# Patient Record
Sex: Female | Born: 1960 | Race: Black or African American | Hispanic: No | Marital: Single | Smoking: Current every day smoker
Health system: Southern US, Community
[De-identification: ages and names within clinical notes are randomized; demographics above are authoritative.]

## PROBLEM LIST (undated history)

## (undated) DIAGNOSIS — M329 Systemic lupus erythematosus, unspecified: Secondary | ICD-10-CM

## (undated) DIAGNOSIS — E119 Type 2 diabetes mellitus without complications: Secondary | ICD-10-CM

## (undated) DIAGNOSIS — E7800 Pure hypercholesterolemia, unspecified: Secondary | ICD-10-CM

## (undated) DIAGNOSIS — M1990 Unspecified osteoarthritis, unspecified site: Secondary | ICD-10-CM

## (undated) DIAGNOSIS — M069 Rheumatoid arthritis, unspecified: Secondary | ICD-10-CM

## (undated) DIAGNOSIS — I10 Essential (primary) hypertension: Secondary | ICD-10-CM

## (undated) DIAGNOSIS — G894 Chronic pain syndrome: Secondary | ICD-10-CM

## (undated) DIAGNOSIS — F329 Major depressive disorder, single episode, unspecified: Secondary | ICD-10-CM

## (undated) DIAGNOSIS — E785 Hyperlipidemia, unspecified: Secondary | ICD-10-CM

## (undated) DIAGNOSIS — G43909 Migraine, unspecified, not intractable, without status migrainosus: Secondary | ICD-10-CM

## (undated) DIAGNOSIS — E0500 Thyrotoxicosis with diffuse goiter without thyrotoxic crisis or storm: Secondary | ICD-10-CM

## (undated) DIAGNOSIS — F32A Depression, unspecified: Secondary | ICD-10-CM

## (undated) DIAGNOSIS — IMO0002 Reserved for concepts with insufficient information to code with codable children: Secondary | ICD-10-CM

## (undated) HISTORY — DX: Thyrotoxicosis with diffuse goiter without thyrotoxic crisis or storm: E05.00

## (undated) HISTORY — PX: BACK SURGERY: SHX140

## (undated) HISTORY — DX: Reserved for concepts with insufficient information to code with codable children: IMO0002

## (undated) HISTORY — DX: Depression, unspecified: F32.A

## (undated) HISTORY — DX: Major depressive disorder, single episode, unspecified: F32.9

## (undated) HISTORY — DX: Systemic lupus erythematosus, unspecified: M32.9

## (undated) HISTORY — PX: ABDOMINAL HYSTERECTOMY: SHX81

## (undated) HISTORY — DX: Type 2 diabetes mellitus without complications: E11.9

---

## 1997-11-01 ENCOUNTER — Emergency Department (HOSPITAL_COMMUNITY): Admission: EM | Admit: 1997-11-01 | Discharge: 1997-11-01 | Payer: Self-pay

## 1999-01-24 ENCOUNTER — Emergency Department (HOSPITAL_COMMUNITY): Admission: EM | Admit: 1999-01-24 | Discharge: 1999-01-24 | Payer: Self-pay | Admitting: *Deleted

## 1999-08-25 ENCOUNTER — Ambulatory Visit (HOSPITAL_COMMUNITY): Admission: RE | Admit: 1999-08-25 | Discharge: 1999-08-25 | Payer: Self-pay | Admitting: Family Medicine

## 1999-08-25 ENCOUNTER — Encounter: Payer: Self-pay | Admitting: Family Medicine

## 1999-10-23 ENCOUNTER — Emergency Department (HOSPITAL_COMMUNITY): Admission: EM | Admit: 1999-10-23 | Discharge: 1999-10-23 | Payer: Self-pay | Admitting: Emergency Medicine

## 2000-02-14 ENCOUNTER — Emergency Department (HOSPITAL_COMMUNITY): Admission: EM | Admit: 2000-02-14 | Discharge: 2000-02-14 | Payer: Self-pay | Admitting: Emergency Medicine

## 2000-02-14 ENCOUNTER — Encounter: Payer: Self-pay | Admitting: Emergency Medicine

## 2000-04-03 ENCOUNTER — Emergency Department (HOSPITAL_COMMUNITY): Admission: EM | Admit: 2000-04-03 | Discharge: 2000-04-03 | Payer: Self-pay | Admitting: Emergency Medicine

## 2000-05-18 ENCOUNTER — Emergency Department (HOSPITAL_COMMUNITY): Admission: EM | Admit: 2000-05-18 | Discharge: 2000-05-18 | Payer: Self-pay | Admitting: Emergency Medicine

## 2000-07-15 ENCOUNTER — Emergency Department (HOSPITAL_COMMUNITY): Admission: EM | Admit: 2000-07-15 | Discharge: 2000-07-15 | Payer: Self-pay | Admitting: Emergency Medicine

## 2000-08-16 ENCOUNTER — Encounter: Admission: RE | Admit: 2000-08-16 | Discharge: 2000-09-12 | Payer: Self-pay | Admitting: Orthopedic Surgery

## 2000-08-31 ENCOUNTER — Emergency Department (HOSPITAL_COMMUNITY): Admission: EM | Admit: 2000-08-31 | Discharge: 2000-08-31 | Payer: Self-pay | Admitting: Emergency Medicine

## 2000-08-31 ENCOUNTER — Encounter: Payer: Self-pay | Admitting: Emergency Medicine

## 2001-02-16 ENCOUNTER — Emergency Department (HOSPITAL_COMMUNITY): Admission: EM | Admit: 2001-02-16 | Discharge: 2001-02-16 | Payer: Self-pay | Admitting: Emergency Medicine

## 2001-03-25 ENCOUNTER — Emergency Department (HOSPITAL_COMMUNITY): Admission: EM | Admit: 2001-03-25 | Discharge: 2001-03-25 | Payer: Self-pay | Admitting: Emergency Medicine

## 2001-03-25 ENCOUNTER — Encounter: Payer: Self-pay | Admitting: Emergency Medicine

## 2001-08-15 ENCOUNTER — Encounter: Payer: Self-pay | Admitting: Family Medicine

## 2001-08-15 ENCOUNTER — Ambulatory Visit (HOSPITAL_COMMUNITY): Admission: RE | Admit: 2001-08-15 | Discharge: 2001-08-15 | Payer: Self-pay | Admitting: Family Medicine

## 2001-08-29 ENCOUNTER — Other Ambulatory Visit: Admission: RE | Admit: 2001-08-29 | Discharge: 2001-08-29 | Payer: Self-pay | Admitting: Obstetrics and Gynecology

## 2001-08-29 ENCOUNTER — Encounter (INDEPENDENT_AMBULATORY_CARE_PROVIDER_SITE_OTHER): Payer: Self-pay | Admitting: Specialist

## 2001-08-29 ENCOUNTER — Encounter: Admission: RE | Admit: 2001-08-29 | Discharge: 2001-08-29 | Payer: Self-pay | Admitting: *Deleted

## 2001-10-16 ENCOUNTER — Encounter: Admission: RE | Admit: 2001-10-16 | Discharge: 2001-10-16 | Payer: Self-pay | Admitting: Orthopedic Surgery

## 2001-10-16 ENCOUNTER — Encounter: Payer: Self-pay | Admitting: Orthopedic Surgery

## 2001-10-19 ENCOUNTER — Encounter: Admission: RE | Admit: 2001-10-19 | Discharge: 2001-10-19 | Payer: Self-pay | Admitting: *Deleted

## 2001-10-26 ENCOUNTER — Encounter: Admission: RE | Admit: 2001-10-26 | Discharge: 2001-10-26 | Payer: Self-pay | Admitting: Orthopedic Surgery

## 2001-10-26 ENCOUNTER — Encounter: Payer: Self-pay | Admitting: Orthopedic Surgery

## 2001-11-14 ENCOUNTER — Inpatient Hospital Stay (HOSPITAL_COMMUNITY): Admission: RE | Admit: 2001-11-14 | Discharge: 2001-11-17 | Payer: Self-pay | Admitting: Obstetrics & Gynecology

## 2001-11-14 ENCOUNTER — Encounter (INDEPENDENT_AMBULATORY_CARE_PROVIDER_SITE_OTHER): Payer: Self-pay | Admitting: *Deleted

## 2001-11-25 ENCOUNTER — Inpatient Hospital Stay (HOSPITAL_COMMUNITY): Admission: AD | Admit: 2001-11-25 | Discharge: 2001-11-25 | Payer: Self-pay | Admitting: Obstetrics & Gynecology

## 2002-02-14 ENCOUNTER — Emergency Department (HOSPITAL_COMMUNITY): Admission: EM | Admit: 2002-02-14 | Discharge: 2002-02-14 | Payer: Self-pay | Admitting: Emergency Medicine

## 2002-03-13 ENCOUNTER — Emergency Department (HOSPITAL_COMMUNITY): Admission: EM | Admit: 2002-03-13 | Discharge: 2002-03-13 | Payer: Self-pay | Admitting: Emergency Medicine

## 2002-05-03 HISTORY — PX: BACK SURGERY: SHX140

## 2002-08-28 ENCOUNTER — Encounter: Admission: RE | Admit: 2002-08-28 | Discharge: 2002-08-28 | Payer: Self-pay | Admitting: Neurosurgery

## 2002-08-28 ENCOUNTER — Encounter: Payer: Self-pay | Admitting: Neurosurgery

## 2002-09-25 ENCOUNTER — Encounter: Payer: Self-pay | Admitting: Family Medicine

## 2002-09-25 ENCOUNTER — Ambulatory Visit (HOSPITAL_COMMUNITY): Admission: RE | Admit: 2002-09-25 | Discharge: 2002-09-25 | Payer: Self-pay | Admitting: Family Medicine

## 2002-10-04 ENCOUNTER — Encounter: Payer: Self-pay | Admitting: Neurosurgery

## 2002-10-04 ENCOUNTER — Ambulatory Visit (HOSPITAL_COMMUNITY): Admission: RE | Admit: 2002-10-04 | Discharge: 2002-10-04 | Payer: Self-pay | Admitting: Neurosurgery

## 2002-10-30 ENCOUNTER — Emergency Department (HOSPITAL_COMMUNITY): Admission: EM | Admit: 2002-10-30 | Discharge: 2002-10-30 | Payer: Self-pay | Admitting: Emergency Medicine

## 2003-03-10 ENCOUNTER — Emergency Department (HOSPITAL_COMMUNITY): Admission: EM | Admit: 2003-03-10 | Discharge: 2003-03-10 | Payer: Self-pay | Admitting: Emergency Medicine

## 2003-09-17 ENCOUNTER — Emergency Department (HOSPITAL_COMMUNITY): Admission: EM | Admit: 2003-09-17 | Discharge: 2003-09-17 | Payer: Self-pay

## 2003-11-18 ENCOUNTER — Inpatient Hospital Stay (HOSPITAL_COMMUNITY): Admission: AD | Admit: 2003-11-18 | Discharge: 2003-11-22 | Payer: Self-pay | Admitting: Psychiatry

## 2003-11-18 ENCOUNTER — Encounter: Payer: Self-pay | Admitting: Emergency Medicine

## 2004-04-18 ENCOUNTER — Inpatient Hospital Stay (HOSPITAL_COMMUNITY): Admission: RE | Admit: 2004-04-18 | Discharge: 2004-04-24 | Payer: Self-pay | Admitting: Psychiatry

## 2004-04-18 ENCOUNTER — Ambulatory Visit: Payer: Self-pay | Admitting: Psychiatry

## 2004-05-11 ENCOUNTER — Ambulatory Visit: Payer: Self-pay | Admitting: Family Medicine

## 2004-05-18 ENCOUNTER — Emergency Department (HOSPITAL_COMMUNITY): Admission: EM | Admit: 2004-05-18 | Discharge: 2004-05-18 | Payer: Self-pay | Admitting: Emergency Medicine

## 2004-05-22 ENCOUNTER — Emergency Department (HOSPITAL_COMMUNITY): Admission: EM | Admit: 2004-05-22 | Discharge: 2004-05-22 | Payer: Self-pay | Admitting: Emergency Medicine

## 2004-05-27 ENCOUNTER — Emergency Department (HOSPITAL_COMMUNITY): Admission: EM | Admit: 2004-05-27 | Discharge: 2004-05-27 | Payer: Self-pay | Admitting: Emergency Medicine

## 2004-06-22 ENCOUNTER — Ambulatory Visit: Payer: Self-pay | Admitting: Family Medicine

## 2004-06-26 ENCOUNTER — Emergency Department (HOSPITAL_COMMUNITY): Admission: EM | Admit: 2004-06-26 | Discharge: 2004-06-26 | Payer: Self-pay | Admitting: Emergency Medicine

## 2004-08-25 ENCOUNTER — Emergency Department (HOSPITAL_COMMUNITY): Admission: EM | Admit: 2004-08-25 | Discharge: 2004-08-25 | Payer: Self-pay | Admitting: Emergency Medicine

## 2004-10-15 ENCOUNTER — Emergency Department (HOSPITAL_COMMUNITY): Admission: EM | Admit: 2004-10-15 | Discharge: 2004-10-16 | Payer: Self-pay | Admitting: Emergency Medicine

## 2004-10-20 ENCOUNTER — Inpatient Hospital Stay (HOSPITAL_COMMUNITY): Admission: RE | Admit: 2004-10-20 | Discharge: 2004-10-26 | Payer: Self-pay | Admitting: Psychiatry

## 2004-10-20 ENCOUNTER — Ambulatory Visit: Payer: Self-pay | Admitting: Psychiatry

## 2004-11-02 ENCOUNTER — Inpatient Hospital Stay (HOSPITAL_COMMUNITY): Admission: RE | Admit: 2004-11-02 | Discharge: 2004-11-06 | Payer: Self-pay | Admitting: Psychiatry

## 2004-12-17 ENCOUNTER — Emergency Department (HOSPITAL_COMMUNITY): Admission: EM | Admit: 2004-12-17 | Discharge: 2004-12-17 | Payer: Self-pay | Admitting: Family Medicine

## 2004-12-26 ENCOUNTER — Emergency Department (HOSPITAL_COMMUNITY): Admission: EM | Admit: 2004-12-26 | Discharge: 2004-12-26 | Payer: Self-pay | Admitting: Emergency Medicine

## 2005-01-14 ENCOUNTER — Emergency Department (HOSPITAL_COMMUNITY): Admission: EM | Admit: 2005-01-14 | Discharge: 2005-01-14 | Payer: Self-pay | Admitting: Family Medicine

## 2005-02-10 ENCOUNTER — Emergency Department (HOSPITAL_COMMUNITY): Admission: EM | Admit: 2005-02-10 | Discharge: 2005-02-10 | Payer: Self-pay | Admitting: Emergency Medicine

## 2005-05-01 ENCOUNTER — Emergency Department (HOSPITAL_COMMUNITY): Admission: EM | Admit: 2005-05-01 | Discharge: 2005-05-01 | Payer: Self-pay | Admitting: Family Medicine

## 2005-08-27 ENCOUNTER — Emergency Department (HOSPITAL_COMMUNITY): Admission: EM | Admit: 2005-08-27 | Discharge: 2005-08-28 | Payer: Self-pay | Admitting: Emergency Medicine

## 2005-12-18 ENCOUNTER — Inpatient Hospital Stay (HOSPITAL_COMMUNITY): Admission: EM | Admit: 2005-12-18 | Discharge: 2005-12-22 | Payer: Self-pay | Admitting: *Deleted

## 2005-12-18 ENCOUNTER — Ambulatory Visit: Payer: Self-pay | Admitting: *Deleted

## 2006-01-21 ENCOUNTER — Ambulatory Visit: Payer: Self-pay | Admitting: Internal Medicine

## 2006-02-06 ENCOUNTER — Inpatient Hospital Stay (HOSPITAL_COMMUNITY): Admission: AD | Admit: 2006-02-06 | Discharge: 2006-02-11 | Payer: Self-pay | Admitting: Psychiatry

## 2006-02-06 ENCOUNTER — Ambulatory Visit: Payer: Self-pay | Admitting: Psychiatry

## 2006-03-26 ENCOUNTER — Ambulatory Visit: Payer: Self-pay | Admitting: *Deleted

## 2006-03-26 ENCOUNTER — Inpatient Hospital Stay (HOSPITAL_COMMUNITY): Admission: EM | Admit: 2006-03-26 | Discharge: 2006-03-31 | Payer: Self-pay | Admitting: *Deleted

## 2006-04-09 ENCOUNTER — Emergency Department (HOSPITAL_COMMUNITY): Admission: EM | Admit: 2006-04-09 | Discharge: 2006-04-09 | Payer: Self-pay | Admitting: Emergency Medicine

## 2006-05-03 DIAGNOSIS — I10 Essential (primary) hypertension: Secondary | ICD-10-CM | POA: Insufficient documentation

## 2007-01-23 DIAGNOSIS — G894 Chronic pain syndrome: Secondary | ICD-10-CM

## 2007-01-23 DIAGNOSIS — F341 Dysthymic disorder: Secondary | ICD-10-CM

## 2007-01-23 DIAGNOSIS — IMO0002 Reserved for concepts with insufficient information to code with codable children: Secondary | ICD-10-CM

## 2007-01-23 DIAGNOSIS — D6489 Other specified anemias: Secondary | ICD-10-CM | POA: Insufficient documentation

## 2007-01-23 DIAGNOSIS — K5900 Constipation, unspecified: Secondary | ICD-10-CM | POA: Insufficient documentation

## 2007-01-23 DIAGNOSIS — F1910 Other psychoactive substance abuse, uncomplicated: Secondary | ICD-10-CM

## 2007-12-08 ENCOUNTER — Emergency Department (HOSPITAL_COMMUNITY): Admission: EM | Admit: 2007-12-08 | Discharge: 2007-12-08 | Payer: Self-pay | Admitting: Emergency Medicine

## 2007-12-30 ENCOUNTER — Emergency Department (HOSPITAL_COMMUNITY): Admission: EM | Admit: 2007-12-30 | Discharge: 2007-12-30 | Payer: Self-pay | Admitting: Emergency Medicine

## 2008-01-05 ENCOUNTER — Other Ambulatory Visit: Payer: Self-pay | Admitting: *Deleted

## 2008-01-05 ENCOUNTER — Ambulatory Visit: Payer: Self-pay | Admitting: *Deleted

## 2008-01-05 ENCOUNTER — Inpatient Hospital Stay (HOSPITAL_COMMUNITY): Admission: RE | Admit: 2008-01-05 | Discharge: 2008-01-08 | Payer: Self-pay | Admitting: *Deleted

## 2008-02-01 ENCOUNTER — Emergency Department (HOSPITAL_COMMUNITY): Admission: EM | Admit: 2008-02-01 | Discharge: 2008-02-01 | Payer: Self-pay | Admitting: Emergency Medicine

## 2008-02-17 ENCOUNTER — Emergency Department (HOSPITAL_COMMUNITY): Admission: EM | Admit: 2008-02-17 | Discharge: 2008-02-17 | Payer: Self-pay | Admitting: Emergency Medicine

## 2008-03-22 ENCOUNTER — Emergency Department (HOSPITAL_COMMUNITY): Admission: EM | Admit: 2008-03-22 | Discharge: 2008-03-22 | Payer: Self-pay | Admitting: Emergency Medicine

## 2008-04-16 ENCOUNTER — Ambulatory Visit: Payer: Self-pay | Admitting: Family Medicine

## 2008-04-16 DIAGNOSIS — M069 Rheumatoid arthritis, unspecified: Secondary | ICD-10-CM | POA: Insufficient documentation

## 2008-04-16 DIAGNOSIS — M329 Systemic lupus erythematosus, unspecified: Secondary | ICD-10-CM

## 2008-04-16 LAB — CONVERTED CEMR LAB
AST: 16 units/L (ref 0–37)
Alkaline Phosphatase: 76 units/L (ref 39–117)
BUN: 11 mg/dL (ref 6–23)
Basophils Absolute: 0 10*3/uL (ref 0.0–0.1)
Calcium: 9.5 mg/dL (ref 8.4–10.5)
Chloride: 106 meq/L (ref 96–112)
Creatinine, Ser: 0.68 mg/dL (ref 0.40–1.20)
Eosinophils Absolute: 0.2 10*3/uL (ref 0.0–0.7)
Hemoglobin: 12.2 g/dL (ref 12.0–15.0)
Lymphocytes Relative: 27 % (ref 12–46)
Lymphs Abs: 2 10*3/uL (ref 0.7–4.0)
MCV: 96.3 fL (ref 78.0–100.0)
Monocytes Absolute: 0.9 10*3/uL (ref 0.1–1.0)
Platelets: 296 10*3/uL (ref 150–400)
Potassium: 3.7 meq/L (ref 3.5–5.3)
RDW: 16.1 % — ABNORMAL HIGH (ref 11.5–15.5)
Total Bilirubin: 0.3 mg/dL (ref 0.3–1.2)
Total Protein: 7.9 g/dL (ref 6.0–8.3)
WBC: 7.3 10*3/uL (ref 4.0–10.5)

## 2008-05-07 ENCOUNTER — Telehealth (INDEPENDENT_AMBULATORY_CARE_PROVIDER_SITE_OTHER): Payer: Self-pay | Admitting: Family Medicine

## 2008-05-15 ENCOUNTER — Encounter (INDEPENDENT_AMBULATORY_CARE_PROVIDER_SITE_OTHER): Payer: Self-pay | Admitting: Family Medicine

## 2008-05-21 ENCOUNTER — Encounter (INDEPENDENT_AMBULATORY_CARE_PROVIDER_SITE_OTHER): Payer: Self-pay | Admitting: Family Medicine

## 2008-05-22 ENCOUNTER — Emergency Department (HOSPITAL_COMMUNITY): Admission: EM | Admit: 2008-05-22 | Discharge: 2008-05-22 | Payer: Self-pay | Admitting: Emergency Medicine

## 2008-05-29 ENCOUNTER — Telehealth (INDEPENDENT_AMBULATORY_CARE_PROVIDER_SITE_OTHER): Payer: Self-pay | Admitting: Family Medicine

## 2008-06-04 ENCOUNTER — Emergency Department (HOSPITAL_COMMUNITY): Admission: EM | Admit: 2008-06-04 | Discharge: 2008-06-04 | Payer: Self-pay | Admitting: Emergency Medicine

## 2008-06-12 ENCOUNTER — Encounter (INDEPENDENT_AMBULATORY_CARE_PROVIDER_SITE_OTHER): Payer: Self-pay | Admitting: Family Medicine

## 2008-06-19 ENCOUNTER — Encounter (INDEPENDENT_AMBULATORY_CARE_PROVIDER_SITE_OTHER): Payer: Self-pay | Admitting: Family Medicine

## 2008-06-26 ENCOUNTER — Emergency Department (HOSPITAL_COMMUNITY): Admission: EM | Admit: 2008-06-26 | Discharge: 2008-06-26 | Payer: Self-pay | Admitting: Emergency Medicine

## 2008-06-27 ENCOUNTER — Telehealth (INDEPENDENT_AMBULATORY_CARE_PROVIDER_SITE_OTHER): Payer: Self-pay | Admitting: Family Medicine

## 2008-07-04 ENCOUNTER — Ambulatory Visit: Payer: Self-pay | Admitting: *Deleted

## 2008-07-04 ENCOUNTER — Inpatient Hospital Stay (HOSPITAL_COMMUNITY): Admission: EM | Admit: 2008-07-04 | Discharge: 2008-07-11 | Payer: Self-pay | Admitting: *Deleted

## 2008-07-10 ENCOUNTER — Encounter (INDEPENDENT_AMBULATORY_CARE_PROVIDER_SITE_OTHER): Payer: Self-pay | Admitting: Family Medicine

## 2008-07-16 ENCOUNTER — Encounter (INDEPENDENT_AMBULATORY_CARE_PROVIDER_SITE_OTHER): Payer: Self-pay | Admitting: Family Medicine

## 2008-08-12 ENCOUNTER — Telehealth (INDEPENDENT_AMBULATORY_CARE_PROVIDER_SITE_OTHER): Payer: Self-pay | Admitting: Family Medicine

## 2008-08-25 ENCOUNTER — Emergency Department (HOSPITAL_COMMUNITY): Admission: EM | Admit: 2008-08-25 | Discharge: 2008-08-25 | Payer: Self-pay | Admitting: Emergency Medicine

## 2008-09-16 ENCOUNTER — Telehealth (INDEPENDENT_AMBULATORY_CARE_PROVIDER_SITE_OTHER): Payer: Self-pay | Admitting: Family Medicine

## 2008-09-30 ENCOUNTER — Encounter (INDEPENDENT_AMBULATORY_CARE_PROVIDER_SITE_OTHER): Payer: Self-pay | Admitting: Family Medicine

## 2008-10-07 ENCOUNTER — Emergency Department (HOSPITAL_COMMUNITY): Admission: EM | Admit: 2008-10-07 | Discharge: 2008-10-07 | Payer: Self-pay | Admitting: Emergency Medicine

## 2008-10-13 ENCOUNTER — Emergency Department (HOSPITAL_COMMUNITY): Admission: EM | Admit: 2008-10-13 | Discharge: 2008-10-13 | Payer: Self-pay | Admitting: Emergency Medicine

## 2008-10-23 ENCOUNTER — Encounter (INDEPENDENT_AMBULATORY_CARE_PROVIDER_SITE_OTHER): Payer: Self-pay | Admitting: Internal Medicine

## 2008-11-21 ENCOUNTER — Telehealth (INDEPENDENT_AMBULATORY_CARE_PROVIDER_SITE_OTHER): Payer: Self-pay | Admitting: Internal Medicine

## 2008-11-25 ENCOUNTER — Encounter (INDEPENDENT_AMBULATORY_CARE_PROVIDER_SITE_OTHER): Payer: Self-pay | Admitting: *Deleted

## 2008-12-24 ENCOUNTER — Emergency Department (HOSPITAL_COMMUNITY): Admission: EM | Admit: 2008-12-24 | Discharge: 2008-12-24 | Payer: Self-pay | Admitting: Emergency Medicine

## 2009-05-17 ENCOUNTER — Emergency Department (HOSPITAL_COMMUNITY): Admission: EM | Admit: 2009-05-17 | Discharge: 2009-05-17 | Payer: Self-pay | Admitting: Emergency Medicine

## 2009-08-18 ENCOUNTER — Emergency Department (HOSPITAL_COMMUNITY): Admission: EM | Admit: 2009-08-18 | Discharge: 2009-08-18 | Payer: Self-pay | Admitting: Emergency Medicine

## 2009-11-18 ENCOUNTER — Emergency Department (HOSPITAL_COMMUNITY): Admission: EM | Admit: 2009-11-18 | Discharge: 2009-11-18 | Payer: Self-pay | Admitting: Emergency Medicine

## 2010-01-22 ENCOUNTER — Emergency Department (HOSPITAL_COMMUNITY): Admission: EM | Admit: 2010-01-22 | Discharge: 2010-01-22 | Payer: Self-pay | Admitting: Emergency Medicine

## 2010-01-27 ENCOUNTER — Emergency Department (HOSPITAL_COMMUNITY): Admission: EM | Admit: 2010-01-27 | Discharge: 2010-01-28 | Payer: Self-pay | Admitting: Emergency Medicine

## 2010-02-23 ENCOUNTER — Emergency Department (HOSPITAL_COMMUNITY): Admission: EM | Admit: 2010-02-23 | Discharge: 2010-02-23 | Payer: Self-pay | Admitting: Emergency Medicine

## 2010-07-16 LAB — URINE CULTURE
Colony Count: NO GROWTH
Culture  Setup Time: 201109280539

## 2010-07-16 LAB — URINE MICROSCOPIC-ADD ON

## 2010-07-16 LAB — URINALYSIS, ROUTINE W REFLEX MICROSCOPIC
Bilirubin Urine: NEGATIVE
Hgb urine dipstick: NEGATIVE
Ketones, ur: NEGATIVE mg/dL
Nitrite: NEGATIVE
Specific Gravity, Urine: 1.027 (ref 1.005–1.030)
Urobilinogen, UA: 1 mg/dL (ref 0.0–1.0)

## 2010-07-16 LAB — WET PREP, GENITAL: Clue Cells Wet Prep HPF POC: NONE SEEN

## 2010-08-10 LAB — CBC
HCT: 37.8 % (ref 36.0–46.0)
Hemoglobin: 12.2 g/dL (ref 12.0–15.0)
MCHC: 32.3 g/dL (ref 30.0–36.0)
MCV: 94 fL (ref 78.0–100.0)
Platelets: 278 10*3/uL (ref 150–400)
RBC: 4.02 MIL/uL (ref 3.87–5.11)

## 2010-08-10 LAB — DIFFERENTIAL
Basophils Absolute: 0.1 10*3/uL (ref 0.0–0.1)
Eosinophils Absolute: 0.2 10*3/uL (ref 0.0–0.7)
Lymphs Abs: 1.5 10*3/uL (ref 0.7–4.0)
Monocytes Absolute: 0.6 10*3/uL (ref 0.1–1.0)
Monocytes Relative: 11 % (ref 3–12)
Neutro Abs: 3.3 10*3/uL (ref 1.7–7.7)

## 2010-08-10 LAB — BASIC METABOLIC PANEL
BUN: 9 mg/dL (ref 6–23)
Creatinine, Ser: 0.78 mg/dL (ref 0.4–1.2)
GFR calc non Af Amer: >60 mL/min (ref >60)
Glucose, Bld: 129 mg/dL — ABNORMAL HIGH (ref 70–99)

## 2010-08-10 LAB — SEDIMENTATION RATE: Sed Rate: 49 mm/hr — ABNORMAL HIGH (ref 0–22)

## 2010-08-10 LAB — RAPID STREP SCREEN (MED CTR MEBANE ONLY): Streptococcus, Group A Screen (Direct): NEGATIVE

## 2010-08-12 LAB — URINALYSIS, ROUTINE W REFLEX MICROSCOPIC
Bilirubin Urine: NEGATIVE
Hgb urine dipstick: NEGATIVE
Nitrite: NEGATIVE
Specific Gravity, Urine: 1.014 (ref 1.005–1.030)

## 2010-08-12 LAB — BASIC METABOLIC PANEL
Chloride: 111 mEq/L (ref 96–112)
GFR calc Af Amer: >60 mL/min (ref >60)
GFR calc non Af Amer: >60 mL/min (ref >60)
Potassium: 3.9 mEq/L (ref 3.5–5.1)

## 2010-08-13 LAB — COMPREHENSIVE METABOLIC PANEL
AST: 19 U/L (ref 0–37)
Albumin: 3.8 g/dL (ref 3.5–5.2)
Chloride: 103 mEq/L (ref 96–112)
Creatinine, Ser: 1.04 mg/dL (ref 0.4–1.2)
GFR calc Af Amer: >60 mL/min (ref >60)
GFR calc non Af Amer: 57 mL/min — ABNORMAL LOW (ref >60)
Potassium: 3.4 mEq/L — ABNORMAL LOW (ref 3.5–5.1)
Total Protein: 7.7 g/dL (ref 6.0–8.3)

## 2010-08-13 LAB — CBC
HCT: 36.8 % (ref 36.0–46.0)
Hemoglobin: 11.9 g/dL — ABNORMAL LOW (ref 12.0–15.0)
MCHC: 32.3 g/dL (ref 30.0–36.0)
MCV: 94.3 fL (ref 78.0–100.0)
RBC: 3.91 MIL/uL (ref 3.87–5.11)
RDW: 16 % — ABNORMAL HIGH (ref 11.5–15.5)

## 2010-08-13 LAB — TSH: TSH: 0.842 u[IU]/mL (ref 0.350–4.500)

## 2010-08-17 LAB — CBC
HCT: 33.6 % — ABNORMAL LOW (ref 36.0–46.0)
MCHC: 32.4 g/dL (ref 30.0–36.0)
MCV: 94.7 fL (ref 78.0–100.0)
Platelets: 269 10*3/uL (ref 150–400)
RDW: 15.5 % (ref 11.5–15.5)
WBC: 9.2 10*3/uL (ref 4.0–10.5)

## 2010-08-17 LAB — POCT I-STAT, CHEM 8
Creatinine, Ser: 0.9 mg/dL (ref 0.4–1.2)
HCT: 37 % (ref 36.0–46.0)
Hemoglobin: 12.6 g/dL (ref 12.0–15.0)
Potassium: 4.3 mEq/L (ref 3.5–5.1)
Sodium: 141 mEq/L (ref 135–145)
TCO2: 26 mmol/L (ref 0–100)

## 2010-08-17 LAB — DIFFERENTIAL
Basophils Relative: 0 % (ref 0–1)
Eosinophils Absolute: 0.2 10*3/uL (ref 0.0–0.7)
Eosinophils Relative: 3 % (ref 0–5)
Lymphs Abs: 1.2 10*3/uL (ref 0.7–4.0)
Neutrophils Relative %: 79 % — ABNORMAL HIGH (ref 43–77)

## 2010-09-01 ENCOUNTER — Emergency Department (HOSPITAL_COMMUNITY)
Admission: EM | Admit: 2010-09-01 | Discharge: 2010-09-01 | Disposition: A | Discharge status: Home / Self Care | Payer: Medicare Other | Attending: Emergency Medicine | Admitting: Emergency Medicine

## 2010-09-01 DIAGNOSIS — I10 Essential (primary) hypertension: Secondary | ICD-10-CM | POA: Insufficient documentation

## 2010-09-01 DIAGNOSIS — K089 Disorder of teeth and supporting structures, unspecified: Secondary | ICD-10-CM | POA: Insufficient documentation

## 2010-09-01 DIAGNOSIS — M069 Rheumatoid arthritis, unspecified: Secondary | ICD-10-CM | POA: Insufficient documentation

## 2010-09-01 DIAGNOSIS — M329 Systemic lupus erythematosus, unspecified: Secondary | ICD-10-CM | POA: Insufficient documentation
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
## 2010-09-18 NOTE — Op Note (Signed)
NAME:  [name], [name]                         ACCOUNT NO.:  [id number]   MEDICAL RECORD NO.:  [id number]                   PATIENT TYPE:  OIB   LOCATION:  2899                                 FACILITY:  [facility]   PHYSICIAN:  Reinaldo Meeker, M.D.              DATE OF BIRTH:  10/07/1960   DATE OF PROCEDURE:  10/04/2002  DATE OF DISCHARGE:                                 OPERATIVE REPORT   PREOPERATIVE DIAGNOSIS:  Herniated disk at L5-S1 left.   POSTOPERATIVE DIAGNOSIS:  Herniated disk at L5-S1 left.   PROCEDURE:  Left L5-S1 interlaminar laminotomy for excision of herniated  disk with operating microscope.  Microdissection of L5-S1 disk and S1 nerve  root.   SURGEON:  Reinaldo Meeker, M.D.   ASSISTANT:  [name]. Pool, M.D.   DESCRIPTION OF PROCEDURE:  After being placed in the prone position, the  patient's back was prepped and draped in the usual sterile fashion.  A  localizing x-ray was taken prior to incision to identify the appropriate  level.  A midline incision was made above the spinous processes of L5 and  S1.  Using the Bovie on cutting current, the incision was carried down to  the spinous processes.  Subperiosteal dissection was then carried out on the  left side of spinous processes and lamina.  Then a McCullough self-retaining  retractor was placed for exposure.  A second x-ray showed approach to the  appropriate level.  Using a highspeed drill, the inferior 1/3 of the L5  lamina and medial 1/3 of the facet joint removed.  It was then used to  remove the superior 1/3 of the S1 lamina.  Residual bone and ligamentum  flavum were removed in a piecemeal fashion.  Microscope was draped, brought  into the field, and used for the remainder of the case.  Using  microdissection technique, the lateral aspect of the thecal sac and S1 nerve  root were identified.  Further coagulation was __________ to identify the L5-  S1 disk which was found to be markedly herniated.  After  coagulating on the  annulus, the annulus was incised with a 15 blade.  Using pituitary rongeurs  and curets, a thorough disk space cleanout was carried out.  At the same  time, great care was taken to avoid injury to the neural elements and this  was successfully done.  At this point, inspection was carried out in all  directions for any evidence of residual compression and none could be  identified.  Large amounts of irrigation were carried out and any bleeding  controlled with bipolar coagulation and Gelfoam.  The wound was then closed  using interrupted Vicryl in the muscle, fascia, subcutaneous and  subcuticular tissues, and staples on the skin. A sterile dressing was then  applied and the patient was extubated and taken to the recovery room in  stable condition.  Reinaldo Meeker, M.D.    ROK/MEDQ  D:  10/04/2002  T:  10/04/2002  Job:  [id number]

## 2010-09-18 NOTE — H&P (Signed)
[name], QUE NO.:  [id number]   MEDICAL RECORD NO.:  [id number]                   PATIENT TYPE:  IPS   LOCATION:  0501                                 FACILITY:  [facility]   PHYSICIAN:  Geoffery Lyons, M.D.                   DATE OF BIRTH:  [birth year]   DATE OF ADMISSION:  11/18/2003  DATE OF DISCHARGE:                         PSYCHIATRIC ADMISSION ASSESSMENT   IDENTIFYING INFORMATION:  The patient is a 50 year old separated African-  American female voluntarily admitted on November 18, 2003.   HISTORY OF PRESENT ILLNESS:  The patient was feeling very depressed and  anxious, having suicidal thoughts with no plan.  She reported some drug use,  smoking marijuana and doing crack cocaine for about a month.  The patient  reports her __________ has been increasing and having increased anxiety.  The patient has left work due to her anxiety.  She has been unable to make  her followup appointments.  She states she gets extremely anxious even just  while waiting out in the lobby.  The patient reports her stressors are  multiple medical problems.  She states that her mother asked if she and her  daughter could move in with her, then she states that now her mother is  complaining about them being with her.  She was having problems with her  finances.  The patient reports her sleep has been varied.  Her appetite has  been varied.  She reports a 30 pound weight loss.  She denies any  hallucinations.   PAST PSYCHIATRIC HISTORY:  This is the first admission to [facility].  No other psychiatric admissions.  She was an outpatient at the  [facility] approximately two months ago.  She reports some benefit from  that.  She reports she has a diagnosis of major depression.   SUBSTANCE ABUSE HISTORY:  Nonsmoker.  She denies any alcohol use.  She  reports some marijuana and crack cocaine use over a month.  She denies any  IV drug use.   PAST MEDICAL  HISTORY:  Primary care [facility]: Dr. Luciana Axe.  She states her  neurosurgeon is Reinaldo Meeker, M.D.  Medical problems: Lupus and anemia.  She reports recent hematuria.  She states that she had back surgery last  June.   MEDICATIONS:  1. She was on Effexor; stopped her medication about three to four months.  2. She states she was supposed to be on some prednisone for her back.  3. In the past, she has been on Zoloft and Neurontin.   DRUG ALLERGIES:  NAPROSYN.   PHYSICAL EXAMINATION:  GENERAL:  The patient was assessed at [facility]gency Department which was reviewed.  VITAL SIGNS:  Temperature 97.1, heart rate 63, respirations 16, blood  pressure 114/70.  She is 5 feet 5 inches tall, 134 pounds.   LABORATORY DATA:  Urine drug screen  was positive for THC, positive for  cocaine.  TSH was 0.737.  BMET was within normal limits.  Urinalysis showed  wbc's 7-10, repeat 3-6 wbc's with Trichomonas noted.   SOCIAL HISTORY:  She is a 50 year old separated African-American female.  She has three children ages 32, 67, and 34.  She lives with her mother.  Her  35 year old is currently with the father of that child.  She is unemployed.  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  No legal problems.   FAMILY HISTORY:  Two sisters have problems with depression and currently on  disability.   MENTAL STATUS EXAM:  She is an alert, young middle-aged female, cooperative,  fair eye contact.  Speech is clear.  Mood is depressed.  She is tearful  throughout the interview.  Thought processes are coherent, logical; no  evidence of psychosis.  Cognitive functioning: Intact.  Memory is good.  Judgment and insight are fair.   ADMISSION DIAGNOSES:   AXIS I:  1. Major depressive disorder.  2. Tetrahydrocannabinol and cocaine abuse.   AXIS II:  Deferred.   AXIS III:  1. Lupus.  2. Ruptured disk.   AXIS IV:  Problems with primary support group,  occupation, other  psychosocial problems, medical problems.   AXIS V:  Current is 30, this past year is 60.   INITIAL PLAN OF CARE:  Plan is a voluntary admission to [facility] for depression and suicidal ideation.  Contract for safety.  Stabilize mood and thinking.  The patient will be started on Cymbalta.  Risks and benefits were discussed.  Also some Neurontin for mood  stabilization and problems with pain.  Medication compliance was discussed  with the patient.  Will repeat urinalysis at this time and obtain a TSH.  Will have a family session with mother if the patient and family members are  agreeable.  The patient is to increase coping skills by attending groups.  The patient is to follow up with mental health and work on relapse.   ESTIMATED LENGTH OF STAY:  Four to six days.     Landry Corporal, N.P.                       Geoffery Lyons, M.D.    JO/MEDQ  D:  11/19/2003  T:  11/19/2003  Job:  [id number]

## 2010-09-18 NOTE — Discharge Summary (Signed)
[name], [name] NO.:  [id number]   MEDICAL RECORD NO.:  [id number]                   PATIENT TYPE:  IPS   LOCATION:  0501                                 FACILITY:  [facility]   PHYSICIAN:  Geoffery Lyons, M.D.                   DATE OF BIRTH:  1960/07/20   DATE OF ADMISSION:  11/18/2003  DATE OF DISCHARGE:  11/22/2003                                 DISCHARGE SUMMARY   CHIEF COMPLAINT AND PRESENT ILLNESS:  This was the first admission to [facility] for this 50 year old separated African-American  female voluntarily admitted.  Feeling very depressed and anxious, having  suicidal thoughts with no plan.  Some drug use, mostly marijuana.  Doing  crack cocaine for a month.  Increased depression.  Increased anxiety.  Left  work due to her anxiety.  Unable to make her follow-up appointments.  Feeling anxious, multiple stressors, multiple medical problems.  She stated  that her mother asked if she and her daughter could move in with her.  Then,  she stated her mother is complaining about them being with her.  Having  financial difficulties.  Reported that her sleep has been varied.  Appetite  has been varied.  Reported 30-pound weight loss.  No hallucinations.   PAST PSYCHIATRIC HISTORY:  First admission to [facility].  Outpatient at the [facility].   SUBSTANCE ABUSE HISTORY:  Denies any alcohol use.  Reports marijuana and  crack cocaine for over a month.   PAST MEDICAL HISTORY:  Lupus and anemia, recent hematuria.   MEDICATIONS:  She was on Effexor.  Stopped her medication 3-4 months prior  to this admission.  Has been on Zoloft and Neurontin.   PHYSICAL EXAMINATION:  Failed to show any acute findings.   LABORATORY DATA:  Drug screen was positive for marijuana, cocaine.  TSH was  0.737.  BMET within normal limits.  Urinalysis shows white blood cells to be  7-10.   MENTAL STATUS EXAM:  Alert, middle-aged female.   Cooperative.  Fair eye  contact.  Speech was clear, normal rate, tempo and production.  Mood was  depressed.  Affect depressed, tearful.  Thought processes were coherent,  logical.  No evidence of psychosis.  No delusions.  No suicidal or homicidal  ideation.  Cognition well-preserved.   ADMISSION DIAGNOSES:   AXIS I:  1. Major depression, recurrent.  2. Marijuana and cocaine abuse.   AXIS II:  No diagnosis.   AXIS III:  1. Lupus.  2. Anemia.  3. Ruptured disk.  4. Urinary tract infection.   AXIS IV:  Moderate.   AXIS V:  Global Assessment of Functioning upon admission 30; highest Global  Assessment of Functioning in the last year 60.   HOSPITAL COURSE:  She was admitted and started individual and group  psychotherapy.  She was started on Ambien for sleep.  She was on Neurontin  100 mg three times a day, Cymbalta 30 mg daily.  She was given Flagyl and  Septra and she was given some Seroquel.  Neurontin was increased to 200 mg  three times a day.  Endorsed difficult time with her mood.  Felt her family  was not being supportive.  She felt that she was asked by the mother to move  in with her to take care of her and she let go of her place in [place] and she  left her older son there but now there have been conflict in relationship  with the mother and she has turned to be not supportive.  Said she cannot  understand how she got herself back on cocaine.  Endorsed a lot of anxiety  as well as feeling depressed.  Overwhelmed with passive suicidal ideation.  Continued to perceive the nonsupport of her family, which made her feel more  anxious, more depressed.  There was a family session with the mother.  She  was able to express the feelings of being unwanted and unloved by the  mother.  They were able to express mutual feelings.  She was very emotional  during the interview but she reported no suicidal ideation.  When her mother  left, she started having a lot of anxiety and  feeling quite overwhelmed.  On  November 25, 2003, she said that the session went better than expected.  Still  focusing on her back pain but overall feeling much better.  On November 22, 2003, she was in full contact with reality.  No suicidal ideation.  No  homicidal ideation.  No hallucinations.  No delusions.  Optimistic.  Felt  much better.  Denied any suicidal or homicidal ideation.  Was going to stay  with the son in [place].  Overall, she felt that she had accomplished a lot by  being able to face the mother and clarify some of the thoughts that she had  held for such a long time.  Felt stronger on her recovery.  Was willing to  maintain a recovery plan, continue her medications and follow up on an  outpatient basis.   DISCHARGE DIAGNOSES:   AXIS I:  1. Major depression, recurrent.  2. Marijuana and cocaine abuse.   AXIS II:  No diagnosis.   AXIS III:  1. Lupus.  2. Ruptured disk.   AXIS IV:  Moderate.   AXIS V:  Global Assessment of Functioning upon discharge 55-60.   DISCHARGE MEDICATIONS:  1. Cymbalta 30 mg per day.  2. Septra DS 1 twice a day.  3. Neurontin 300 mg three times a day.  4. Ambien 10 mg at bedtime for sleep.   FOLLOW UP:  [facility], [facility] and Dr.  __________.                                               Geoffery Lyons, M.D.    IL/MEDQ  D:  12/18/2003  T:  12/19/2003  Job:  [id number]
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
## 2010-09-18 NOTE — Discharge Summary (Signed)
Jocelyn, Walker               ACCOUNT NO.:  [id number]   MEDICAL RECORD NO.:  [id number]          PATIENT TYPE:  IPS   LOCATION:  0300                          FACILITY:  [facility]   PHYSICIAN:  Anselm Jungling, MD  DATE OF BIRTH:  30-Dec-1960   DATE OF ADMISSION:  02/06/2006  DATE OF DISCHARGE:  02/11/2006                                 DISCHARGE SUMMARY   IDENTIFYING DATA AND REASON FOR ADMISSION:  This is one of several [facility]  admissions for [name], a 50 year old single African American female with a  history of polysubstance abuse and mood disorder.  She came with multiple  stressors including financial, family members moving away including her  children.  She described no purpose in life.  She also stated I am tired  of dealing with it,referring to chronic pain.  She has a history of  rheumatoid arthritis, systemic and discoid lupus, and fibromyalgia.  She had  overdosed on Cymbalta prior to admission.  Please refer to the admission  note for further details pertaining to the symptoms, circumstances and  history that led to her hospitalization.  She was given initial Axis I  diagnoses of major depressive disorder, recurrent, history of polysubstance  abuse, and pain disorder.   MEDICAL AND LABORATORY:  The patient was medically and physically assessed  by the psychiatric nurse practitioner upon admission.  She came to [place] with  the above medical history.  There were no acute medical issues during this  brief inpatient stay.  She was given Percocet 5/325, 1 tablet q.4-6 h p.r.n.  pain for her chronic pain, which the patient described as adequate.  She had  some constipation  and was given MiraLax for this.  The patient also had a  history of noncompliance with previous arrangements that have been made for  her for medical follow up regarding her medical conditions.   The patient was also treated for urinary Trichomonas with Flagyl 2000 mg, in  a single dose.  She was briefly  claimed given Plaquenil and Lyrica, but  these were discontinued by the time of discharge.   HOSPITAL COURSE:  The patient presented as a well-nourished, well-developed  female who was fully oriented, cooperative and appropriate.  Her mood was  very depressed with flat affect.  Her thoughts and speech were normally  organized, without any signs or symptoms of psychosis.  She denied any  suicidal intention or plan, but was hopeless and helpless.   Because she had relapsed on cocaine, she was given Symmetrel 100 mg twice  daily for cocaine cravings.  She was also restarted on Cymbalta 60 mg daily.   The patient participated in various therapeutic groups and activities.  Her  participation was not noted to be marked by a great deal of motivation or  enthusiasm, and she needed encouragement to attend therapeutic groups and  activities.  She could not identify any particular family members to bring  in for family conferencing.   She remained depressed, and without suicidal ideation, but her depression  lessened somewhat over the course of her inpatient stay.  On the sixth hospital day the patient indicated that she felt ready for  discharge.  She indicated that she had formulated a plan to go to [place]  state to be with her adult children.  Her plan was to travel initially to  [place], [place] where other family would help arrange for her  transportation to [place].   AFTERCARE:  The patient was to follow-up with appropriate services in [place] when she arrives.  She was given samples of medications as well  as written prescriptions for Symmetrel 100 mg b.i.d., Cymbalta 60 mg daily,  Septra DS 1 tablet twice daily, and Percocet 5/325, 1.5 tablets four times  daily, a 14-day supply.   DISCHARGE DIAGNOSES:  AXIS I: Depressive disorder not otherwise specified,  history of polysubstance abuse.  AXIS II: Deferred.  AXIS III: Recent urinary tract infection.  AXIS IV:  Stressors severe.  AXIS V: Global assessment of functioning on discharge 65.      Anselm Jungling, MD  Electronically Signed     SPB/MEDQ  D:  02/12/2006  T:  02/13/2006  Job:  [id number]

## 2010-09-18 NOTE — H&P (Signed)
[facility]  Patient:    [name], [name] Visit Number: [id number] MRN: [id number]          Service Type: Attending:  Clement Husbands, M.D. Dictated by:   Clement Husbands, M.D.   CC:         [place] in the OB/GYN Teaching Service office at [facility]   History and Physical  HISTORY OF PRESENT ILLNESS:   A 50 year old black female gravida 3 para 3 is admitted for hysterectomy.  She was seen in the GYN clinic on August 03, 2001 with a history of very heavy and abnormal menstrual periods.  Her last Pap smear had been performed about two years prior.  She was having cyclic menstrual periods lasting about five days.  They were very heavy, however. She would use six to seven pads a day.  She was staining her underwear and relates that it would run down her leg.  Recently it was staining her clothing while in Bible class.  A year ago her periods were not as heavy.  In addition she has been having increasing dysmenorrhea that begins about four days premenstrual and lasts throughout the entire period.  This is mainly on the left side.  She has had recent problem with dyspareunia.  An ultrasound two years ago showed "fibroids."  A pelvic ultrasound done August 16, 2001 revealed a uterus that measured 9.6 x 5.0 x 5.2 cm.  There were multiple fibroids noted.  The most easily measured one was 3.2 x 2.7 in the right anterior fundal area.  Both ovaries were normal.  Endometrial biopsy was done which showed benign secretory endometrium with no evidence of hyperplasia.  Her hemoglobin was 12.1 and her hematocrit 38.9.  Platelet count was normal at 249.  Pap smear was benign.  PAST MEDICAL HISTORY:  MEDICAL PROBLEMS:             1. She has discoid lupus.                               2. She has been having a back pain/disk problem.                                  She has been receiving epidural injections                                  for  that.  MEDICATIONS:                  Anti-inflammatory medication for her back.  ALLERGIES:                    None.  SURGERY:                      Significant for all three of her pregnancies were delivered by cesarean section with a low vertical abdominal incision. She had tubal sterilization with her last procedure in 1991.  PHYSICAL EXAMINATION:  VITAL SIGNS:                  Blood pressure 76/56, pulse 76, weight 154.  NECK:  Thyroid is normal.  CHEST:                        Clear to auscultation and percussion.  HEART:                        Regular sinus rhythm without murmurs or enlargement.  BREASTS:                      Normal.  ABDOMEN:                      Slightly obese, soft, and negative.  There is a wide vertical low abdominal incisional scar.  PELVIC:                       External genitalia and BUS glands were normal. Vaginal wall was epithelialized.  The cervix was epithelialized.  The uterus is 8-10 weeks size with a fundal fibroid noted.  She had tenderness on the left side of the uterus.  Ovaries were nonpalpable.  No separate adnexal masses were noted.  PLAN:                         The patient will undergo abdominal hysterectomy. The procedure was explained in detail to her.  She understands that there are some risks associated with the procedure which include, but is not limited to, infection, blood clots in the legs, injury to the bladder and colon.  FINAL DIAGNOSES:              1. Symptomatic menorrhagia.                               2. Fibroid uterus.                               3. Dysmenorrhea. Dictated by:   Clement Husbands, M.D. Attending:  Clement Husbands, M.D. DD:  10/26/01 TD:  10/26/01 Job: [id number]

## 2010-09-18 NOTE — Discharge Summary (Signed)
NAME:  [name], [name] NO.:  [id number]   MEDICAL RECORD NO.:  [id number]          PATIENT TYPE:  BIPS   LOCATION:                                F[facility]ILITY:  [facility]   PHYSICIAN:  Jeanice Lim, M.D. DATE OF BIRTH:  [birth year]   DATE OF ADMISSION:  10/20/2004  DATE OF DISCHARGE:  10/26/2004                                 DISCHARGE SUMMARY   HISTORY:  This is a 50 year old African-American female, voluntarily  admitted, a single mother of 42-year-old daughter admitted into [facility] two months ago and then reported everything went down hill.  Feeling overwhelmed by poverty.  No washer or dryer.  Mother and sister were  not helping as per patient, and patient experienced panic attacks daily,  racing thoughts.  Admitted to cocaine use one or two times weekly seeming to  minimize impact of this and cannot afford medications but also smoked  marijuana in addition to using cocaine.  Followed up by Dr. Hortencia Pilar at  [facility].  Third admission to [facility] and last one was April 18, 2004, to April 24, 2004.   History of manic depressive disorder, recurrent and polysubstance abuse,  questionable cocaine an cannabis dependence.  Had done well on Seroquel and  Cymbalta and possibly Effexor at different times in the past.   Medical problems include ovarian cysts diagnosed on October 15, 2004, in the  emergency room, fibromyalgia and lupus.   MEDICATIONS:  Include etodolac, Cymbalta.   DRUG ALLERGIES:  Naproxen.   PHYSICAL:  NEUROLOGIC:  Within normal limits.  __________ within normal  limits.  Mental status exam:  Fully alert, blunted affect, cooperative  manner, tearful at times.  Speech soft tone, depressed, hopeless.  Thought  process goal directed, coherent, positive suicidal ideation feeling that she  cannot keep on living.  Positive thoughts of overdose.  Cognitive __________  intact.  Judgment and insight were  fair.   ADMISSION DIAGNOSIS:  AXIS I:  Cocaine abuse, cannabis abuse, major  depressive disorder, recurrent, severe.   AXIS II:  None.   AXIS III:  None.   AXIS IV:  Moderate to severe stress from parents and problems finding a  support group.   AXIS V:  25/58.   The patient was admitted.  Ordered routine p.r.n. medications.  Underwent  further monitoring.  Was encouraged to participate in individual and group  therapy.  The patient was monitored.  Reported initial insomnia and  decreased sleep.  Does not leave the house.  Depakote was optimized.  Seroquel was started.  There are no clear signs of allergic reaction, and  patient was given a Strep test, complained of sore throat, myalgia and mild  headache.  Patient initially was agreeing to go to [facility] and the case  master spent a great deal of time facilitating this, however, patient then  changed her mind, implied that she would return to her apartment and follow  up at [facility] and with [facility] intensive outpatient  program which may or may not be adequate  due to the severity of her  addiction and minimizing.  Therefore, prognosis was guarded, however,  patient was discharged in pretty good condition with no acute withdrawal  symptoms.  Motivation remained abstinent.  Tolerating medications without  side effects.  Medication education given, and patient was discharged on  Amantadine for cocaine cravings, Lodine, Trazodone 50 mg 1-1/2 q.h.s.,  Ambien 10 mg q.h.s., Effexor-XR 75 mg q.a.m., Effexor-XR 37.5 mg at 12 p.m.,  Cymbalta 30 mg q.a.m., Depakote-ER 250 mg 3 q.h.s., Seroquel 25 mg q.h.s.,  Percocet 5/325, 2 tabs up to 3 times per day.   Patient was to follow up with Dr. Lang Snow on October 29, 2004, at 3:30 p.m. and  [facility] for a walk-in assessment, intensive outpatient as patient was __________  motivation to do so.  Patient's Cymbalta was to be tapered and discontinued.  Written for a four-day taper.   She was titrated over to Effexor.   DISCHARGE DIAGNOSES:  GAF on discharge was 55.   Patient was given medication education.       JEM/MEDQ  D:  12/08/2004  T:  12/09/2004  Job:  [id number]

## 2010-09-18 NOTE — Discharge Summary (Signed)
NAMEARBELL, WYCOFF NO.:  [id number]   MEDICAL RECORD NO.:  [id number]          PATIENT TYPE:  IPS   LOCATION:  0501                          FACILITY:  [facility]   PHYSICIAN:  Jeanice Lim, M.D. DATE OF BIRTH:  Sep 12, 1960   DATE OF ADMISSION:  11/02/2004  DATE OF DISCHARGE:  11/06/2004                                 DISCHARGE SUMMARY   IDENTIFYING DATA:  This is a 50 year old separated African-American female  involuntarily admitted presenting with a history of depression, overwhelmed,  confused as to what to do.  Thought she would possibly kill herself.   MEDICATIONS:  Depakote, Cymbalta, Effexor, Imodium.   DRUG ALLERGIES:  NAPROSYN.   PHYSICAL AND NEUROLOGIC EXAMINATION:  Essentially within normal limits.   ADMISSION LABS:  Routine admission labs within normal limits.   MENTAL STATUS EXAM:  Alert, cooperative, fair eye contact.  Speech, soft-  spoken.  Mood depressed.  Affect flat.  Thought processes goal-directed.  Cognitive intact, psychotic symptoms.  Judgment and insight were impaired.  The patient did have fleeting suicidal thoughts and had multiple possible  plans to kill herself and felt that she needed to stop the cocaine or she  would commit suicide.   HOSPITAL COURSE:  The patient was admitted, ordered routine p.r.n.  medications.  Symmetrel for cocaine cravings.  She was stabilized on  medications.  Monitored for adverse drug reactions.  Resumed all previous  medications she had responded to and was interested in a 20-day program,  individual sessions held.  The patient talked about stressors and coping  skills, triggers and developed a relapse prevention plan.  The patient  reported doing well now feeling that she was ready to go home.  The patient  planned to go to [facility] in [place] and was discharged to follow up with  physician for medications and remain abstinent and seek all resources  available for the fee to get into [facility].   The patient was given  medication education again.  I emphasized the importance of compliance with  medications as well as abstinence from substances, no alcohol and to work  100% on the relapse prevention plan and effect of substances on mood and  safety remained clear.  The patient seemed to be sincere.  Medication  education given.  The patient was discharged on Effexor XR 75 mg q.a.m.,  Risperdal 0.5 mg 1/2 q.a.m. and 2 at 8 p.m., Ambien 10 mg q.h.s. p.r.n.,  Celebrex 200 mg b.i.d., Symmetrel 100 mg b.i.d., Percocet 5/500 q.8h. p.r.n.  pain.  The patient was to follow up with Dr. Lang Snow at [facility]  on July 13 at 8:30.  The patient was to go to [facility] for outpatient treatment.   DISCHARGE DIAGNOSES:  Same as admitting.  Axis V.  Global assessment of functioning on discharge was 55.     JEM/MEDQ  D:  12/10/2004  T:  12/10/2004  Job:  [id number]

## 2010-09-18 NOTE — Discharge Summary (Signed)
[name], Walker NO.:  [id number]   MEDICAL RECORD NO.:  [id number]          PATIENT TYPE:  IPS   LOCATION:  0307                          FACILITY:  [facility]   PHYSICIAN:  Anselm Jungling, MD  DATE OF BIRTH:  1960-06-02   DATE OF ADMISSION:  12/18/2005  DATE OF DISCHARGE:  12/22/2005                                 DISCHARGE SUMMARY   IDENTIFYING DATA AND REASON FOR ADMISSION:  This was a readmission to our  [facility] for [name], a 50 year old separated African  American female.  She presented as a walk-in on the day of admission,  reporting that she was feeling depressed and suicidal without plan.  She had  been having increasing crying spells over the past two weeks.  She had just  learned that her son had AIDS.  Her 8 year old old daughter recently  elected to move out and live with her father.  In addition, she was having  financial problems, was in constant pain from lupus and fibromyalgia, and  had relapsed on crack cocaine.  Please refer to the admission note for  further details pertaining to the symptoms, circumstances and history that  led to her hospitalization.  She was given an initial Axis I diagnosis of  polysubstance dependence, and substance-induced mood disorder.   MEDICAL AND LABORATORY:  The patient was medically and physically assessed  by the psychiatric nurse practitioner.  Her usual  physician is Dr. Onalee Hua  located in [place], [place], and she is also followed by a Dr. Cloretta Ned,  rheumatologist at the [facility].  She has a history of  systemic lupus erythematosus, and associated symptoms of fibromyalgia.  The  patient reported that she had been on an outpatient regimen of Plaquenil and  prednisone, but she had apparently not been taking these in the weeks prior  to admission.  We ordered laboratory consisting of ANA, rheumatoid factor,  and ESR, to help determine if there is any active rheumatoid  disease going  on, given her complaints of arthritic pain.  These results were pending at  the time of discharge and this dictation.   The patient had  been accustomed to taking codeine based products to control  her pain, and requested these on several occasions during this  hospitalization.  However, this was declined, given the patient's  inclination towards substance abuse.  Instead, she was given Ultram in doses  of 50-100 mg up to four times daily as needed for pain.  The patient  reported that this was effective.   The patient also appeared to have a urinary tract infection and was treated  with Keflex 500 mg b.i.d..  Towards the end of her stay she reported that  she was developing typical symptoms of a yeast infection.  For this, she was  given a single dose of Diflucan 150 mg.   HOSPITAL COURSE:  The patient was admitted to the adult inpatient  psychiatric service.  She presented as a well-nourished, well-developed  adult female who was pleasant and cooperative throughout her stay.  Her mood  was depressed with sad affect.  She was able to talk about the various  family and financial stressors that had been plaguing her.  She felt  extremely remorseful about her 1-day cocaine relapse.   She participated in various therapeutic groups and activities and discharge  planning.   She was continued on her usual Cymbalta 30 mg twice daily.   She was also given Catapres 0.1 mg to assist with withdrawal symptoms, based  upon a detox protocol.   The patient worked with case management regarding the possibility of her  going to an assisted living facility, but by the time of discharge, the  patient changed her mind, stating that she would rather go home.  It  appeared that she learned that her landlord was going to allow her  additional time to come up with the money necessary to continue to pay her  rent.   The patient was felt  appropriate for discharge on the fifth hospital  day.  At that time she continued to deny suicidal ideation, and appeared to be  tolerating all medications, and indicated that she will follow-up with  medication and outpatient treatment services.   AFTERCARE:  The patient was to follow-up with [facility] with an appointment on 12/31/2005 and [place], [place].  The patient was instructed to go to her usual medical physicians regarding  care of her chronic Lupus.   DISCHARGE MEDICATIONS:  Cymbalta 30 mg b.i.d., Keflex 500 mg b.i.d. for 3  days, then discontinue and Ultram 100 mg p.r.n. up to four times daily.   DISCHARGE DIAGNOSES:  AXIS I: Polysubstance abuse/dependence, substance-  induced mood disorder.  AXIS II: Deferred.  AXIS III: History of systemic lupus erythematosus, fibromyalgia, recent  urinary tract infection.  AXIS IV: Stressors severe.  AXIS V: Global assessment of functioning on discharge 65.      Anselm Jungling, MD  Electronically Signed     SPB/MEDQ  D:  12/23/2005  T:  12/23/2005  Job:  [id number]

## 2010-09-18 NOTE — Discharge Summary (Signed)
   NAME:  [name], [name]                        ACCOUNT NO.:  [id number]   MEDICAL RECORD NO.:  [id number]                   PATIENT TYPE:  MAT   LOCATION:  MATC                                 FACILITY:  [facility]   PHYSICIAN:  Clement Husbands, M.D.         DATE OF BIRTH:  Dec 22, 1960   DATE OF ADMISSION:  11/14/2001  DATE OF DISCHARGE:  11/25/2001                                 DISCHARGE SUMMARY   HISTORY OF PRESENT ILLNESS:  The patient is a 50 year old black female, para  3, was admitted with symptomatic menorrhagia, fibroid uterus, and  dysmenorrhea.  The details are in the dictated history and physical.   HOSPITAL COURSE:  She underwent an abdominal hysterectomy with division of  pelvic adhesions.  Prophylactic antibiotic were used.  Postoperatively, she  did well.  Her first day she had a low grade fever when then went away.  She  progressed to a regular diet, ambulation, and passing flatus.  Skin staples  were removed on the third postoperative day with Steri-Strips applied.  She  was discharged in improved condition to be seen in the clinic in four weeks.   CONDITION ON DISCHARGE:  Improved.   DISCHARGE MEDICATIONS:  1. Tylenol No. 3 #30 tablets with instructions.  2. Ibuprofen 800 mg tablets #20 take one q.8h. p.r.n.   LABORATORY DATA:  White blood cell count 9800, hemoglobin 12.4, hematocrit  38.3.  Postoperative hemoglobin 10.3, hematocrit 31.5.  Routine chemistries  were normal.  Urinalysis was negative.  Pathology revealed a cervix with  acute and chronic cervicitis, endometrium with secretory phase endometrium  and no evidence of hyperplasia or malignancy, myometrium without  adenomyosis, fibrous adhesions, and hypertrophic skin scar.   FINAL DIAGNOSES:  1. Adenomyosis.  2. Dysmenorrhea.  3. Menorrhagia.                                                 Clement Husbands, M.D.    EFR/MEDQ  D:  12/08/2001  T:  12/09/2001  Job:  [id number]

## 2010-09-18 NOTE — Op Note (Signed)
[facility]  Patient:    [name], [name] Visit Number: [id number] MRN: [id number]          Service Type: GYN Location: 910A [id number] [id number] Attending Physician:  Karsten Fells Dictated by:   Clement Husbands, M.D. Proc. Date: 11/14/01 Admit Date:  11/14/2001 Discharge Date: 11/17/2001                             Operative Report  PREOPERATIVE DIAGNOSES:       Symptomatic menorrhagia, dysmenorrhea, fibroid uterus, status post cesarean section x3.  POSTOPERATIVE DIAGNOSES:      Symptomatic menorrhagia, dysmenorrhea, fibroid uterus, status post cesarean section x3.  OPERATION:                    Abdominal hysterectomy with division of adhesions.  SURGEON:                      Clement Husbands, M.D.  ASSISTANT:                    Georgina Peer, M.D.  ANESTHESIA:                   General.  PROCEDURE:                    The patient under satisfactory anesthesia in the supine position.  The abdomen was prepped.  Vagina prepped.  Bladder catheterized and the abdomen draped.  Vertical skin incision was made around her old extremely wide vertical scar and carried up to the umbilicus.  The old scar was excised down to the rectus fascia.  Rectus fascia was incised in the midline.  Peritoneal cavity was entered.  There was some omental adhesions in the upper right side of the incision which were clamped, divided, and ligated. This freed up the omentum.  Self retaining Terressa Koyanagi was positioned.  The bladder blade was not used.  Small and large bowel were packed out of the pelvis.  There were adhesions in the left side of the uterus from her previous cesarean sections which pulled the bladder up a little bit on the left side. These were early on partially sharply taken down.  The ovaries appeared to be normal.  There were some adhesions around the superior pole of the left ovary which were taken down later in the case and will be  mentioned later.  Cornu portion of the uterus was grasped with Kelly clamps. The uterus was upper limits of normal size.  Both ureters were seen in their normal course. Infundibulopelvic ligaments were clamped on each side, divided, and sutured with 0 Vicryl suture used throughout the case unless otherwise mentioned. Parametrial tissue was clamped, divided, and sutured.  Uterine vessels on th right side were clamped, divided, and suture ligated.  The vesicouterine peritoneum was dissected on the right side where it was free and carried over to the left side and then up a little bit superiorly which helped to free up the vesicouterine adhesions on that side.  Parametrial tissue was again clamped, divided, and sutured.  Uterosacral ligaments were then bilaterally clamped, divided, and sutured.  Angle clamps were placed on each side of what was thought to be the junction of vagina and cervix and incised.  This did not, however, enter the upper vagina.  The upper vagina was grasped with Kocher clamp  and incised laterally superior to the clamp with entry into the vagina.  Using Satinsky scissors circumferentially the cervix and uterus were removed.  The angles of the vagina were sutured with figure-of-eight 0 Vicryl suture. The vaginal cuff was then closed with a running locking 0 Vicryl suture.  To decrease the aperture of the vaginal cuff on each side a figure-of-eight 0 Vicryl suture was placed anterior to posterior.  Hemostasis was good.  There was one little bleeding site near the right angle which required an extra figure-of-eight suture.  Pelvis was irrigated and hemostasis looked to be good.  Using a #1 silk suture the uterosacral ligaments were sutured to the posterior aspect of the vaginal cuff.  The pelvis was reperitonealized with a couple of interrupted 2-0 Vicryl sutures.  The right tube and ovary were again looked at and were normal.  Around the left ovary were a couple of small  6-8 mm cysts and there were adhesions holding the superior pole of the ovary against the side wall.  These were divided and this completely freed up the remainder of the left tube and ovary.  Because it seemed to hang into the cul-de-sac, it was fixed a little more widely to the round ligament with an interrupted Vicryl suture.  Packs and retractor were removed.  Anterior peritoneum was closed with a running 0 Vicryl suture.  Rectus fascia was closed with interrupted figure-of-eight #1 PDS suture.  Subcutaneous tissue was approximated with interrupted 2-0 Vicryl.  Skin edges were approximated with side skin staples. Estimated blood loss was 400 cc.  Sponge and needle count was correct.  The patient tolerated the procedure well and was returned to the recovery room in satisfactory condition. Dictated by:   Clement Husbands, M.D. Attending Physician:  Karsten Fells DD:  11/14/01 TD:  11/17/01 Job: [id number]
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
## 2010-09-25 ENCOUNTER — Emergency Department (HOSPITAL_COMMUNITY)
Admission: EM | Admit: 2010-09-25 | Discharge: 2010-09-25 | Disposition: A | Discharge status: Home / Self Care | Payer: Medicare Other | Attending: Emergency Medicine | Admitting: Emergency Medicine

## 2010-09-25 DIAGNOSIS — I10 Essential (primary) hypertension: Secondary | ICD-10-CM | POA: Insufficient documentation

## 2010-09-25 DIAGNOSIS — G8929 Other chronic pain: Secondary | ICD-10-CM | POA: Insufficient documentation

## 2010-09-25 DIAGNOSIS — F3289 Other specified depressive episodes: Secondary | ICD-10-CM | POA: Insufficient documentation

## 2010-09-25 DIAGNOSIS — IMO0001 Reserved for inherently not codable concepts without codable children: Secondary | ICD-10-CM | POA: Insufficient documentation

## 2010-09-25 DIAGNOSIS — F329 Major depressive disorder, single episode, unspecified: Secondary | ICD-10-CM | POA: Insufficient documentation

## 2010-09-25 DIAGNOSIS — M329 Systemic lupus erythematosus, unspecified: Secondary | ICD-10-CM | POA: Insufficient documentation

## 2011-02-01 LAB — CBC
HCT: 40.3
Hemoglobin: 13
MCHC: 32.3
MCV: 94.3
Platelets: 338
RBC: 4.27
RDW: 16.3 — ABNORMAL HIGH
WBC: 14.3 — ABNORMAL HIGH

## 2011-02-01 LAB — POCT I-STAT, CHEM 8
BUN: 9
Chloride: 105
HCT: 46
Potassium: 3.6

## 2011-02-01 LAB — URINALYSIS, ROUTINE W REFLEX MICROSCOPIC
Glucose, UA: NEGATIVE
Hgb urine dipstick: NEGATIVE
Ketones, ur: >80 — AB
Nitrite: NEGATIVE
Protein, ur: 30 — AB
Specific Gravity, Urine: 1.032 — ABNORMAL HIGH
Urobilinogen, UA: 0.2
pH: 6

## 2011-02-01 LAB — DIFFERENTIAL
Basophils Absolute: 0
Basophils Relative: 0
Eosinophils Absolute: 0
Eosinophils Relative: 0
Lymphocytes Relative: 7 — ABNORMAL LOW
Lymphs Abs: 1
Monocytes Absolute: 0.1
Monocytes Relative: 1 — ABNORMAL LOW
Neutro Abs: 13.2 — ABNORMAL HIGH
Neutrophils Relative %: 92 — ABNORMAL HIGH

## 2011-02-01 LAB — URINE MICROSCOPIC-ADD ON

## 2011-02-01 LAB — GC/CHLAMYDIA PROBE AMP, GENITAL
Chlamydia, DNA Probe: NEGATIVE
GC Probe Amp, Genital: NEGATIVE

## 2011-02-01 LAB — WET PREP, GENITAL

## 2011-02-02 LAB — CBC
Hemoglobin: 11.8 — ABNORMAL LOW
RBC: 3.79 — ABNORMAL LOW
RDW: 16.9 — ABNORMAL HIGH
WBC: 5.6

## 2011-02-02 LAB — DIFFERENTIAL
Basophils Absolute: 0.1
Lymphocytes Relative: 29
Monocytes Absolute: 0.5
Neutro Abs: 3.1

## 2011-02-02 LAB — BASIC METABOLIC PANEL
BUN: 9
Chloride: 107
Potassium: 3.7

## 2011-02-03 LAB — DIFFERENTIAL
Basophils Absolute: 0
Basophils Relative: 0
Eosinophils Absolute: 0.3
Monocytes Absolute: 0.5
Neutro Abs: 8 — ABNORMAL HIGH
Neutrophils Relative %: 78 — ABNORMAL HIGH

## 2011-02-03 LAB — TSH: TSH: 0.564

## 2011-02-03 LAB — URINE CULTURE: Colony Count: 9000

## 2011-02-03 LAB — URINALYSIS, ROUTINE W REFLEX MICROSCOPIC
Nitrite: NEGATIVE
Specific Gravity, Urine: 1.024
pH: 6

## 2011-02-03 LAB — POCT I-STAT, CHEM 8
Calcium, Ion: 1.12
Chloride: 108
HCT: 33 — ABNORMAL LOW
Hemoglobin: 11.2 — ABNORMAL LOW
Potassium: 2.7 — CL

## 2011-02-03 LAB — URINE MICROSCOPIC-ADD ON

## 2011-02-03 LAB — T4, FREE: Free T4: 1.12

## 2011-02-03 LAB — HEPATIC FUNCTION PANEL
Alkaline Phosphatase: 53
Bilirubin, Direct: <0.1
Total Bilirubin: 0.4

## 2011-02-03 LAB — RAPID URINE DRUG SCREEN, HOSP PERFORMED: Tetrahydrocannabinol: POSITIVE — AB

## 2011-02-03 LAB — POCT PREGNANCY, URINE: Preg Test, Ur: NEGATIVE

## 2011-02-03 LAB — BASIC METABOLIC PANEL
Calcium: 9.1
Calcium: 9
Creatinine, Ser: 0.83
GFR calc Af Amer: >60
GFR calc non Af Amer: >60
GFR calc non Af Amer: >60
Glucose, Bld: 166 — ABNORMAL HIGH
Glucose, Bld: 141 — ABNORMAL HIGH
Potassium: 3.4 — ABNORMAL LOW
Sodium: 139
Sodium: 142

## 2011-02-03 LAB — CBC
MCHC: 32.3
RDW: 15.9 — ABNORMAL HIGH

## 2011-02-03 LAB — MAGNESIUM: Magnesium: 2.2

## 2011-02-03 LAB — GC/CHLAMYDIA PROBE AMP, URINE: Chlamydia, Swab/Urine, PCR: NEGATIVE

## 2014-08-15 ENCOUNTER — Encounter (HOSPITAL_COMMUNITY): Payer: Self-pay

## 2014-08-15 ENCOUNTER — Emergency Department (HOSPITAL_COMMUNITY)
Admission: EM | Admit: 2014-08-15 | Discharge: 2014-08-15 | Disposition: A | Discharge status: Home / Self Care | Payer: Medicare (Managed Care) | Attending: Emergency Medicine | Admitting: Emergency Medicine

## 2014-08-15 DIAGNOSIS — Z79899 Other long term (current) drug therapy: Secondary | ICD-10-CM | POA: Insufficient documentation

## 2014-08-15 DIAGNOSIS — M329 Systemic lupus erythematosus, unspecified: Secondary | ICD-10-CM

## 2014-08-15 DIAGNOSIS — H0520 Unspecified exophthalmos: Secondary | ICD-10-CM | POA: Insufficient documentation

## 2014-08-15 DIAGNOSIS — M069 Rheumatoid arthritis, unspecified: Secondary | ICD-10-CM | POA: Insufficient documentation

## 2014-08-15 DIAGNOSIS — I10 Essential (primary) hypertension: Secondary | ICD-10-CM | POA: Insufficient documentation

## 2014-08-15 DIAGNOSIS — Z720 Tobacco use: Secondary | ICD-10-CM | POA: Insufficient documentation

## 2014-08-15 DIAGNOSIS — Z794 Long term (current) use of insulin: Secondary | ICD-10-CM | POA: Insufficient documentation

## 2014-08-15 DIAGNOSIS — E780 Pure hypercholesterolemia: Secondary | ICD-10-CM | POA: Insufficient documentation

## 2014-08-15 DIAGNOSIS — M1990 Unspecified osteoarthritis, unspecified site: Secondary | ICD-10-CM | POA: Diagnosis not present

## 2014-08-15 DIAGNOSIS — M3210 Systemic lupus erythematosus, organ or system involvement unspecified: Secondary | ICD-10-CM | POA: Diagnosis not present

## 2014-08-15 DIAGNOSIS — G43909 Migraine, unspecified, not intractable, without status migrainosus: Secondary | ICD-10-CM | POA: Diagnosis present

## 2014-08-15 DIAGNOSIS — G43809 Other migraine, not intractable, without status migrainosus: Secondary | ICD-10-CM | POA: Diagnosis not present

## 2014-08-15 HISTORY — DX: Unspecified osteoarthritis, unspecified site: M19.90

## 2014-08-15 HISTORY — DX: Rheumatoid arthritis, unspecified: M06.9

## 2014-08-15 HISTORY — DX: Migraine, unspecified, not intractable, without status migrainosus: G43.909

## 2014-08-15 HISTORY — DX: Pure hypercholesterolemia, unspecified: E78.00

## 2014-08-15 HISTORY — DX: Essential (primary) hypertension: I10

## 2014-08-15 LAB — I-STAT CHEM 8, ED
BUN: 11 mg/dL (ref 6–23)
CALCIUM ION: 1.2 mmol/L (ref 1.12–1.23)
CREATININE: 0.6 mg/dL (ref 0.50–1.10)
Chloride: 102 mmol/L (ref 96–112)
GLUCOSE: 122 mg/dL — AB (ref 70–99)
HCT: 44 % (ref 36.0–46.0)
HEMOGLOBIN: 15 g/dL (ref 12.0–15.0)
Potassium: 3.6 mmol/L (ref 3.5–5.1)
SODIUM: 142 mmol/L (ref 135–145)
TCO2: 24 mmol/L (ref 0–100)

## 2014-08-15 MED ORDER — METHYLPREDNISOLONE SODIUM SUCC 125 MG IJ SOLR
125.0000 mg | Freq: Once | INTRAMUSCULAR | Status: AC
  Administered 2014-08-15: 125 mg via INTRAVENOUS
  Filled 2014-08-15: qty 2

## 2014-08-15 MED ORDER — PROCHLORPERAZINE EDISYLATE 5 MG/ML IJ SOLN
10.0000 mg | Freq: Once | INTRAMUSCULAR | Status: AC
  Administered 2014-08-15: 10 mg via INTRAVENOUS
  Filled 2014-08-15: qty 2

## 2014-08-15 MED ORDER — MAGNESIUM SULFATE 2 GM/50ML IV SOLN
2.0000 g | Freq: Once | INTRAVENOUS | Status: AC
  Administered 2014-08-15: 2 g via INTRAVENOUS
  Filled 2014-08-15: qty 50

## 2014-08-15 MED ORDER — PREDNISONE 50 MG PO TABS: ORAL_TABLET | ORAL | Status: DC

## 2014-08-15 MED ORDER — HYDROCODONE-ACETAMINOPHEN 5-325 MG PO TABS: ORAL_TABLET | ORAL | Status: DC

## 2014-08-15 MED ORDER — SODIUM CHLORIDE 0.9 % IV BOLUS (SEPSIS)
1000.0000 mL | Freq: Once | INTRAVENOUS | Status: AC
  Administered 2014-08-15: 1000 mL via INTRAVENOUS

## 2014-08-15 MED ORDER — MORPHINE SULFATE 4 MG/ML IJ SOLN: 4.0000 mg | Freq: Once | INTRAMUSCULAR | Status: DC

## 2014-08-15 MED ORDER — KETOROLAC TROMETHAMINE 30 MG/ML IJ SOLN
30.0000 mg | Freq: Once | INTRAMUSCULAR | Status: AC
  Administered 2014-08-15: 30 mg via INTRAVENOUS
  Filled 2014-08-15: qty 1

## 2014-08-15 MED ORDER — BUTALBITAL-APAP-CAFFEINE 50-325-40 MG PO TABS: 1.0000 | ORAL_TABLET | Freq: Four times a day (QID) | ORAL | Status: DC | PRN

## 2014-08-15 MED ORDER — DIPHENHYDRAMINE HCL 50 MG/ML IJ SOLN
25.0000 mg | Freq: Once | INTRAMUSCULAR | Status: AC
  Administered 2014-08-15: 25 mg via INTRAVENOUS
  Filled 2014-08-15: qty 1

## 2014-08-15 NOTE — ED Provider Notes (Signed)
CSN: [id number]     Arrival date & time 08/15/14  1307 History   First MD Initiated Contact with Patient 08/15/14 1311     Chief Complaint  Patient presents with  . Migraine     (Consider location/radiation/quality/duration/timing/severity/associated sxs/prior Treatment) HPI   [name] is a 54 y.o. female complaining of a migraine that won't go away and a lupus flair.  The headache and the lupus flair started yesterday.  The migraine went away yesterday after she took Norvasc, but she woke up at 8:30 this morning and it was back.  The migraine is described as a dull pain in the front, center of her forehead and behind her eyes, 9/10 pain.  She is photophobic and phonophobic.  She also has nausea and dizziness with her migraine.  She had one episode of non-bloody, non-bilious emesis this morning.  She states this is a typical migraine for her and says it is not the worst one she's ever had.  She usually comes to the ED when her migraines get bad.  She is prescribed lisinopril daily, and Norvasc for when the lisinopril doesn't work.  She tried both today without relief.  She also has all over weakness and body aches, which she states is typical of a lupus flair.  She was seeing a rheumatologist in [place] for her lupus, but she just moved here last week and has not established new care yet. She says that steroids typically help with the flairs.  She denies chest pain, shortness of breath, rhinorrhea, sore throat, fever, chills, or neck pain.      Past Medical History  Diagnosis Date  . Migraine   . Arthritis   . Hypertension   . Rheumatoid arthritis   . Hypercholesteremia    Past Surgical History  Procedure Laterality Date  . Back surgery    . Abdominal hysterectomy     History reviewed. No pertinent family history. History  Substance Use Topics  . Smoking status: Light Tobacco Smoker -- 0.25 packs/day    Types: Cigarettes  . Smokeless tobacco: Not on file  . Alcohol Use: No    OB History    No data available     Review of Systems  10 systems reviewed and found to be negative, except as noted in the HPI.   Allergies  Naproxen and Sulfa antibiotics  Home Medications   Prior to Admission medications   Medication Sig Start Date End Date Taking? Authorizing Provider  amLODipine (NORVASC) 5 MG tablet Take 10 mg by mouth daily.   Yes Historical Provider, MD  atorvastatin (LIPITOR) 20 MG tablet Take 20 mg by mouth daily.   Yes Historical Provider, MD  docusate sodium (COLACE) 100 MG capsule Take 100 mg by mouth 2 (two) times daily.   Yes Historical Provider, MD  DULoxetine (CYMBALTA) 60 MG capsule Take 60 mg by mouth daily.   Yes Historical Provider, MD  folic acid (FOLVITE) 1 MG tablet Take 1 mg by mouth daily.   Yes Historical Provider, MD  hydroxychloroquine (PLAQUENIL) 200 MG tablet Take 200 mg by mouth 2 (two) times daily.   Yes Historical Provider, MD  insulin glargine (LANTUS) 100 UNIT/ML injection Inject 10 Units into the skin daily.   Yes Historical Provider, MD  lisinopril-hydrochlorothiazide (PRINZIDE,ZESTORETIC) 20-12.5 MG per tablet Take 2 tablets by mouth daily.   Yes Historical Provider, MD  methotrexate (RHEUMATREX) 2.5 MG tablet Take 10 mg by mouth every 7 (seven) days. Monday   Yes Historical  Provider, MD  butalbital-acetaminophen-caffeine (FIORICET) 50-325-40 MG per tablet Take 1 tablet by mouth every 6 (six) hours as needed for headache. 08/15/14   Joni Reining Shylo Zamor, PA-C  HYDROcodone-acetaminophen (NORCO/VICODIN) 5-325 MG per tablet Take 1-2 tablets by mouth every 6 hours as needed for pain and/or cough. 08/15/14   Edgerrin Correia, PA-C  predniSONE (DELTASONE) 50 MG tablet Take 1 tablet daily with breakfast 08/15/14   Jud Fanguy, PA-C   BP 121/77 mmHg  Pulse 103  Temp(Src) 98.6 F (37 C)  Resp 18  Ht 5\' 5"  (1.651 m)  Wt 156 lb (70.761 kg)  BMI 25.96 kg/m2  SpO2 99% Physical Exam  Constitutional: She is oriented to person, place,  and time. She appears well-developed and well-nourished. No distress.  HENT:  Head: Normocephalic and atraumatic.  Mouth/Throat: Oropharynx is clear and moist.  Exophthalmos.  Eyes: Conjunctivae and EOM are normal. Pupils are equal, round, and reactive to light.  Cardiovascular: Normal rate, regular rhythm and intact distal pulses.   Pulmonary/Chest: Effort normal and breath sounds normal. No stridor. No respiratory distress. She has no wheezes. She has no rales. She exhibits no tenderness.  Abdominal: Soft. Bowel sounds are normal. She exhibits no mass. There is no tenderness. There is no rebound and no guarding.  Musculoskeletal: Normal range of motion.  Neurological: She is alert and oriented to person, place, and time.  II-Visual fields grossly intact. III/IV/VI-Extraocular movements intact.  Pupils reactive bilaterally. V/VII-Smile symmetric, equal eyebrow raise,  facial sensation intact VIII- Hearing grossly intact IX/X-Normal gag XI-bilateral shoulder shrug XII-midline tongue extension Motor: 5/5 bilaterally with normal tone and bulk Cerebellar: Normal finger-to-nose  and normal heel-to-shin test.   Romberg negative Ambulates with a coordinated gait   Skin:  Scattered areas of hyperpigmentation to face, chest and bilateral upper extremities,  Psychiatric: She has a normal mood and affect.  Nursing note and vitals reviewed.   ED Course  Procedures (including critical care time) Labs Review Labs Reviewed  I-STAT CHEM 8, ED - Abnormal; Notable for the following:    Glucose, Bld 122 (*)    All other components within normal limits    Imaging Review No results found.   EKG Interpretation None      MDM   Final diagnoses:  Exacerbation of systemic lupus  Other migraine without status migrainosus, not intractable   Filed Vitals:   08/15/14 1307 08/15/14 1313  BP:  121/77  Pulse:  103  Temp:  98.6 F (37 C)  Resp:  18  Height:  5\' 5"  (1.651 m)  Weight:  156  lb (70.761 kg)  SpO2: 97% 99%    Medications  sodium chloride 0.9 % bolus 1,000 mL (1,000 mLs Intravenous New Bag/Given 08/15/14 1421)  magnesium sulfate IVPB 2 g 50 mL (2 g Intravenous New Bag/Given 08/15/14 1421)  methylPREDNISolone sodium succinate (SOLU-MEDROL) 125 mg/2 mL injection 125 mg (125 mg Intravenous Given 08/15/14 1423)  prochlorperazine (COMPAZINE) injection 10 mg (10 mg Intravenous Given 08/15/14 1422)  diphenhydrAMINE (BENADRYL) injection 25 mg (25 mg Intravenous Given 08/15/14 1422)  ketorolac (TORADOL) 30 MG/ML injection 30 mg (30 mg Intravenous Given 08/15/14 1448)    [name] is a pleasant 54 y.o. female presenting with headache consistent with prior migraine exacerbations. Patient states that she takes amlodipine and lisinopril for her headaches. I've advised her that this is potentially dangerous and not to do this. Recently moved to the area from [place], case management has been involved and she will [facility]. She is also saying that she has a lupus flare with diffuse myalgia. No fever, sore throat, cough, rhinorrhea, pharyngitis to suggest influenza. Will start on prednisone burst. She is specifically asking for Norco.   Presentation is like pts typical HA and non concerning for [facility], [facility], Meningitis, or temporal arteritis. Pt is afebrile with no focal neuro deficits, nuchal rigidity, or change in vision. Pt verbalizes understanding and is agreeable with plan to dc.  Evaluation does not show pathology that would require ongoing emergent intervention or inpatient treatment. Pt is hemodynamically stable and mentating appropriately. Discussed findings and plan with patient/guardian, who agrees with care plan. All questions answered. Return precautions discussed and outpatient follow up given.   New Prescriptions   BUTALBITAL-ACETAMINOPHEN-CAFFEINE (FIORICET) 50-325-40 MG PER TABLET    Take 1 tablet by mouth every 6 (six) hours as  needed for headache.   HYDROCODONE-ACETAMINOPHEN (NORCO/VICODIN) 5-325 MG PER TABLET    Take 1-2 tablets by mouth every 6 hours as needed for pain and/or cough.   PREDNISONE (DELTASONE) 50 MG TABLET    Take 1 tablet daily with breakfast       Wynetta Emery, PA-C 08/15/14 1550  Glynn Octave, MD 08/15/14 1721

## 2014-08-15 NOTE — Discharge Instructions (Signed)
Please follow with your primary care doctor in the next 2 days for a check-up. They must obtain records for further management.  ° °Do not hesitate to return to the Emergency Department for any new, worsening or concerning symptoms.  ° °

## 2014-08-15 NOTE — Discharge Planning (Signed)
Jocelyn Sortino [name], RN, [place], [place] [phone] ED CM consulted to meet with patient concerning f/u care with PCP and patient does not have insurance as she just moved here from [place]. Pt presented to [facility] ED today with migraine.  Met with patient at bedside, confirmed informaton. Pt  reports not having access to f/u care with PCP, or insurance coverage. Discussed with patient importance and benefits of  establishing PCP, and not utilizing the ED for primary care needs. Pt verbalized understanding and is in agreement. Discussed other options, provided list of local  affordable PCPs.  Pt voiced interest in the [facility] and [place].  [facility] Brochure given with address, phone number, and the services highlighted. Explained that there is a Customer service manager on site who will assist with [employer] and process. Pt verbalized understanding.

## 2014-08-15 NOTE — ED Notes (Signed)
Pt reports migraine beginning yesterday afternoon.  Pt reports photophobia.  Pt has a hx of migraines and reports that this one "just won't go away."

## 2014-08-22 ENCOUNTER — Encounter: Payer: Self-pay | Admitting: Family Medicine

## 2014-08-22 ENCOUNTER — Ambulatory Visit: Payer: Medicare (Managed Care) | Attending: Family Medicine | Admitting: Family Medicine

## 2014-08-22 VITALS — BP 104/68 | HR 97 | Temp 98.3°F | Resp 18 | Ht 65.5 in | Wt 154.0 lb

## 2014-08-22 DIAGNOSIS — M069 Rheumatoid arthritis, unspecified: Secondary | ICD-10-CM | POA: Diagnosis not present

## 2014-08-22 DIAGNOSIS — E119 Type 2 diabetes mellitus without complications: Secondary | ICD-10-CM | POA: Diagnosis not present

## 2014-08-22 DIAGNOSIS — F341 Dysthymic disorder: Secondary | ICD-10-CM | POA: Diagnosis not present

## 2014-08-22 DIAGNOSIS — I10 Essential (primary) hypertension: Secondary | ICD-10-CM

## 2014-08-22 DIAGNOSIS — M609 Myositis, unspecified: Secondary | ICD-10-CM

## 2014-08-22 DIAGNOSIS — R1900 Intra-abdominal and pelvic swelling, mass and lump, unspecified site: Secondary | ICD-10-CM

## 2014-08-22 DIAGNOSIS — M329 Systemic lupus erythematosus, unspecified: Secondary | ICD-10-CM

## 2014-08-22 DIAGNOSIS — G894 Chronic pain syndrome: Secondary | ICD-10-CM

## 2014-08-22 DIAGNOSIS — IMO0001 Reserved for inherently not codable concepts without codable children: Secondary | ICD-10-CM

## 2014-08-22 DIAGNOSIS — M791 Myalgia: Secondary | ICD-10-CM

## 2014-08-22 LAB — POCT URINALYSIS DIPSTICK
Blood, UA: NEGATIVE
GLUCOSE UA: NEGATIVE
Ketones, UA: NEGATIVE
LEUKOCYTES UA: NEGATIVE
NITRITE UA: NEGATIVE
Protein, UA: 30
UROBILINOGEN UA: 1
pH, UA: 6

## 2014-08-22 LAB — POCT GLYCOSYLATED HEMOGLOBIN (HGB A1C): Hemoglobin A1C: 6.5

## 2014-08-22 LAB — GLUCOSE, POCT (MANUAL RESULT ENTRY): POC GLUCOSE: 102 mg/dL — AB (ref 70–99)

## 2014-08-22 MED ORDER — INSULIN PEN NEEDLE 31G X 8 MM MISC: 1.0000 "application " | Freq: Every day | Status: DC

## 2014-08-22 MED ORDER — TRUEPLUS LANCETS 28G MISC: 1.0000 | Freq: Three times a day (TID) | Status: DC

## 2014-08-22 MED ORDER — ACETAMINOPHEN-CODEINE #3 300-30 MG PO TABS: 1.0000 | ORAL_TABLET | Freq: Three times a day (TID) | ORAL | Status: DC | PRN

## 2014-08-22 MED ORDER — DULOXETINE HCL 60 MG PO CPEP: 60.0000 mg | ORAL_CAPSULE | Freq: Every day | ORAL | Status: AC

## 2014-08-22 MED ORDER — LISINOPRIL-HYDROCHLOROTHIAZIDE 20-12.5 MG PO TABS: 2.0000 | ORAL_TABLET | Freq: Every day | ORAL | Status: AC

## 2014-08-22 MED ORDER — PREDNISONE 10 MG PO TABS: 10.0000 mg | ORAL_TABLET | Freq: Every day | ORAL | Status: AC

## 2014-08-22 MED ORDER — INSULIN GLARGINE 100 UNIT/ML SOLOSTAR PEN: 10.0000 [IU] | PEN_INJECTOR | Freq: Every day | SUBCUTANEOUS | Status: AC

## 2014-08-22 MED ORDER — ATORVASTATIN CALCIUM 20 MG PO TABS: 20.0000 mg | ORAL_TABLET | Freq: Every day | ORAL | Status: AC

## 2014-08-22 MED ORDER — AMLODIPINE BESYLATE 5 MG PO TABS: 5.0000 mg | ORAL_TABLET | Freq: Every day | ORAL | Status: AC

## 2014-08-22 MED ORDER — TRUERESULT BLOOD GLUCOSE W/DEVICE KIT: 1.0000 | PACK | Freq: Three times a day (TID) | Status: DC

## 2014-08-22 MED ORDER — GLUCOSE BLOOD VI STRP: 1.0000 | ORAL_STRIP | Freq: Three times a day (TID) | Status: DC

## 2014-08-22 MED ORDER — MIRTAZAPINE 7.5 MG PO TABS: 7.5000 mg | ORAL_TABLET | Freq: Every day | ORAL | Status: DC

## 2014-08-22 NOTE — Assessment & Plan Note (Addendum)
A: patient with rash on neck, face, back, upper chest no oral  or ocular lesions no hematuria P:   Prednisone 10 mg daily Tylenol #3 to replace vicodin Rheumatology referral Pain management referral

## 2014-08-22 NOTE — Progress Notes (Signed)
   Subjective:    Patient ID: [name], female    DOB: 17-Nov-1960, 54 y.o.   MRN: [id number] CC: estabblish care, HTN, DM2, lupus, RA  HPI  1. Diabetes: taking lantus. No low CBGs. Also on daily steroid.   2. HTN: taking prinzide. Smoking. 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  3. Lupus/RA: on plaquenil, methotrexate and daily prednisone. Last rheumatologist in [place]. Moved back to [place] 2 weeks ago.  Has some paperwork of labs she is due for. Denies active flare.   4. Pelvic mass: reports that she was diagnosed with a pelvic mass in [place]. No records with her. Has a CT report at home. ? Adnexal mass as she is s/p hysterectomy. No pelvic pain.   Soc Hx: light smoker Med Hx: lupus and RA Surg Hx: s/p hysterectomy  Review of Systems  Constitutional: Positive for appetite change. Negative for fever, chills and unexpected weight change.       Had rx for [facility] in [place], has chronic anorexia   Eyes: Negative.   Respiratory: Negative.   Cardiovascular: Negative.   Gastrointestinal: Negative.   Endocrine: Negative.   Genitourinary: Negative for hematuria.  Musculoskeletal: Positive for myalgias, back pain, joint swelling and arthralgias. Negative for gait problem, neck pain and neck stiffness.  Skin: Positive for rash. Negative for wound.  Allergic/Immunologic: Positive for immunocompromised state.       On methotrexate   Hematological: Negative.   Psychiatric/Behavioral: Positive for dysphoric mood. Negative for suicidal ideas.       Objective:   Physical Exam BP 104/68 mmHg  Pulse 97  Temp(Src) 98.3 F (36.8 C) (Oral)  Resp 18  Ht 5' 5.5" (1.664 m)  Wt 154 lb (69.854 kg)  BMI 25.23 kg/m2  SpO2 97%  LMP 08/21/2014 General appearance: alert, cooperative and no distress  Eyes: normal conjunctiva Throat: normal oropharynx Lungs: clear to auscultation bilaterally Heart: regular rate and rhythm, S1, S2 normal, no murmur, click, rub or  gallop Extremities: extremities normal, atraumatic, no cyanosis or edema  Skin: hyper pigemented macules on chest, upper back  Joint: swelling noted at PIP joints in hands with clubbing in fingers    Lab Results  Component Value Date   HGBA1C 6.50 08/22/2014   CBG 102      Assessment & Plan:

## 2014-08-22 NOTE — Patient Instructions (Addendum)
[name],  Thank you for coming in today. It was a pleasure meeting you. I look forward to being your primary doctor.  1. Diabetes: Well controlled. Continue lantus 10 Check sugars  Diabetes blood sugar goals  Fasting (in AM before breakfast, 8 hrs of no eating or drinking (except water or unsweetened coffee or tea): 90-110 After meals: < 160,  2 hrs after meals  No low sugars: nothing < 70   2. HTN: BP well controlled. Continue prinzide norvasc as needed for high BPs > 140/90  3. Lupus and RA: Prednisone 10 mg daily Tylenol #3 to replace vicodin Rheumatology referral Pain management referral   4. Pelvic mass: drop off CT report for me to review Plan for Gyn referral  5. Depression with poor appetite Refilled cymbalta  Remeron for appetite   F/u: 1. Next week fasting for labs and to drop off CT report for me to review 2. With me in office in 4 weeks   Dr. Armen Pickup

## 2014-08-22 NOTE — Progress Notes (Signed)
Establish Care HFU due migraine  Hx Lupus

## 2014-08-23 LAB — MICROALBUMIN, URINE: MICROALB UR: 1.1 mg/dL (ref <2.0)

## 2014-08-26 DIAGNOSIS — R1900 Intra-abdominal and pelvic swelling, mass and lump, unspecified site: Secondary | ICD-10-CM | POA: Insufficient documentation

## 2014-08-26 NOTE — Assessment & Plan Note (Signed)
Diabetes: Well controlled. Continue lantus 10 Check sugars  Diabetes blood sugar goals  Fasting (in AM before breakfast, 8 hrs of no eating or drinking (except water or unsweetened coffee or tea): 90-110 After meals: < 160,  2 hrs after meals  No low sugars: nothing < 70   2.

## 2014-08-26 NOTE — Assessment & Plan Note (Signed)
?   Pelvic mass: drop off CT report for me to review Plan for Gyn referral

## 2014-08-26 NOTE — Assessment & Plan Note (Signed)
RA: no active flare, some joint changes consistent with RA  Prednisone 10 mg daily Tylenol #3 to replace vicodin Rheumatology referral Pain management referral

## 2014-08-26 NOTE — Assessment & Plan Note (Signed)
Depression with poor appetite Refilled cymbalta  Remeron for appetite

## 2014-08-26 NOTE — Assessment & Plan Note (Addendum)
HTN: BP well controlled. Continue prinzide norvasc as needed for high BPs > 140/90

## 2014-08-28 ENCOUNTER — Telehealth: Payer: Self-pay | Admitting: *Deleted

## 2014-08-28 NOTE — Telephone Encounter (Signed)
-----   Message from Dessa Phi, MD sent at 08/23/2014  9:05 AM EDT ----- Normal urine microalbumin

## 2014-08-28 NOTE — Telephone Encounter (Signed)
Unable to contact Pt  Phone disconnected

## 2014-09-12 ENCOUNTER — Encounter: Payer: Self-pay | Admitting: *Deleted

## 2014-09-12 NOTE — Progress Notes (Signed)
Patient ID: [name], female   DOB: 10-06-1960, 54 y.o.   MRN: [id number]    Dr Chrissie Noa Truslow's office called to say patient did not show, or call to cancel her recent appointment.  It is there policy that if a new patient does not call to cancel and/or does not come to their first appointment that they will no longer accept that patient into their practice.  Forwarding to our referral coordinator and patient's PCP

## 2014-09-12 NOTE — Progress Notes (Signed)
Thank you :)

## 2014-09-13 NOTE — Progress Notes (Signed)
Patient ID: [name], female   DOB: March 26, 1961, 54 y.o.   MRN: [id number] Noted

## 2014-11-10 ENCOUNTER — Emergency Department (HOSPITAL_COMMUNITY): Payer: No Typology Code available for payment source

## 2014-11-10 ENCOUNTER — Encounter (HOSPITAL_COMMUNITY): Payer: Self-pay | Admitting: Emergency Medicine

## 2014-11-10 ENCOUNTER — Emergency Department (HOSPITAL_COMMUNITY)
Admission: EM | Admit: 2014-11-10 | Discharge: 2014-11-10 | Disposition: A | Discharge status: Home / Self Care | Payer: No Typology Code available for payment source | Attending: Emergency Medicine | Admitting: Emergency Medicine

## 2014-11-10 DIAGNOSIS — S161XXA Strain of muscle, fascia and tendon at neck level, initial encounter: Secondary | ICD-10-CM | POA: Diagnosis not present

## 2014-11-10 DIAGNOSIS — M25511 Pain in right shoulder: Secondary | ICD-10-CM

## 2014-11-10 DIAGNOSIS — Z720 Tobacco use: Secondary | ICD-10-CM | POA: Diagnosis not present

## 2014-11-10 DIAGNOSIS — Z872 Personal history of diseases of the skin and subcutaneous tissue: Secondary | ICD-10-CM | POA: Diagnosis not present

## 2014-11-10 DIAGNOSIS — Z8739 Personal history of other diseases of the musculoskeletal system and connective tissue: Secondary | ICD-10-CM | POA: Insufficient documentation

## 2014-11-10 DIAGNOSIS — Y9389 Activity, other specified: Secondary | ICD-10-CM | POA: Insufficient documentation

## 2014-11-10 DIAGNOSIS — S4991XA Unspecified injury of right shoulder and upper arm, initial encounter: Secondary | ICD-10-CM | POA: Insufficient documentation

## 2014-11-10 DIAGNOSIS — E119 Type 2 diabetes mellitus without complications: Secondary | ICD-10-CM | POA: Diagnosis not present

## 2014-11-10 DIAGNOSIS — Y998 Other external cause status: Secondary | ICD-10-CM | POA: Diagnosis not present

## 2014-11-10 DIAGNOSIS — Y92410 Unspecified street and highway as the place of occurrence of the external cause: Secondary | ICD-10-CM | POA: Diagnosis not present

## 2014-11-10 DIAGNOSIS — Z8659 Personal history of other mental and behavioral disorders: Secondary | ICD-10-CM | POA: Diagnosis not present

## 2014-11-10 DIAGNOSIS — I10 Essential (primary) hypertension: Secondary | ICD-10-CM | POA: Insufficient documentation

## 2014-11-10 DIAGNOSIS — S39012A Strain of muscle, fascia and tendon of lower back, initial encounter: Secondary | ICD-10-CM | POA: Diagnosis not present

## 2014-11-10 DIAGNOSIS — S199XXA Unspecified injury of neck, initial encounter: Secondary | ICD-10-CM | POA: Diagnosis present

## 2014-11-10 HISTORY — DX: Rheumatoid arthritis, unspecified: M06.9

## 2014-11-10 HISTORY — DX: Hyperlipidemia, unspecified: E78.5

## 2014-11-10 HISTORY — DX: Major depressive disorder, single episode, unspecified: F32.9

## 2014-11-10 HISTORY — DX: Type 2 diabetes mellitus without complications: E11.9

## 2014-11-10 MED ORDER — HYDROCODONE-ACETAMINOPHEN 5-325 MG PO TABS
1.0000 | ORAL_TABLET | Freq: Once | ORAL | Status: AC
  Administered 2014-11-10: 1 via ORAL
  Filled 2014-11-10: qty 1

## 2014-11-10 MED ORDER — HYDROCODONE-ACETAMINOPHEN 5-325 MG PO TABS: 1.0000 | ORAL_TABLET | Freq: Four times a day (QID) | ORAL | Status: DC | PRN

## 2014-11-10 MED ORDER — CYCLOBENZAPRINE HCL 5 MG PO TABS: 5.0000 mg | ORAL_TABLET | Freq: Three times a day (TID) | ORAL | Status: DC | PRN
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
## 2014-11-11 ENCOUNTER — Encounter: Payer: Self-pay | Admitting: Family Medicine

## 2015-03-19 ENCOUNTER — Emergency Department (HOSPITAL_COMMUNITY): Payer: Commercial Managed Care - HMO

## 2015-03-19 ENCOUNTER — Encounter (HOSPITAL_COMMUNITY): Payer: Self-pay

## 2015-03-19 ENCOUNTER — Emergency Department (HOSPITAL_COMMUNITY)
Admission: EM | Admit: 2015-03-19 | Discharge: 2015-03-20 | Disposition: A | Discharge status: Home / Self Care | Payer: Commercial Managed Care - HMO | Attending: Emergency Medicine | Admitting: Emergency Medicine

## 2015-03-19 DIAGNOSIS — W1839XA Other fall on same level, initial encounter: Secondary | ICD-10-CM | POA: Insufficient documentation

## 2015-03-19 DIAGNOSIS — M069 Rheumatoid arthritis, unspecified: Secondary | ICD-10-CM | POA: Insufficient documentation

## 2015-03-19 DIAGNOSIS — E860 Dehydration: Secondary | ICD-10-CM | POA: Diagnosis not present

## 2015-03-19 DIAGNOSIS — R55 Syncope and collapse: Secondary | ICD-10-CM | POA: Diagnosis present

## 2015-03-19 DIAGNOSIS — E785 Hyperlipidemia, unspecified: Secondary | ICD-10-CM | POA: Insufficient documentation

## 2015-03-19 DIAGNOSIS — Y9289 Other specified places as the place of occurrence of the external cause: Secondary | ICD-10-CM | POA: Diagnosis not present

## 2015-03-19 DIAGNOSIS — I10 Essential (primary) hypertension: Secondary | ICD-10-CM | POA: Insufficient documentation

## 2015-03-19 DIAGNOSIS — Y9389 Activity, other specified: Secondary | ICD-10-CM | POA: Insufficient documentation

## 2015-03-19 DIAGNOSIS — S92511A Displaced fracture of proximal phalanx of right lesser toe(s), initial encounter for closed fracture: Secondary | ICD-10-CM | POA: Diagnosis not present

## 2015-03-19 DIAGNOSIS — F17210 Nicotine dependence, cigarettes, uncomplicated: Secondary | ICD-10-CM | POA: Diagnosis not present

## 2015-03-19 DIAGNOSIS — G43909 Migraine, unspecified, not intractable, without status migrainosus: Secondary | ICD-10-CM | POA: Diagnosis not present

## 2015-03-19 DIAGNOSIS — F329 Major depressive disorder, single episode, unspecified: Secondary | ICD-10-CM | POA: Insufficient documentation

## 2015-03-19 DIAGNOSIS — E119 Type 2 diabetes mellitus without complications: Secondary | ICD-10-CM | POA: Diagnosis not present

## 2015-03-19 DIAGNOSIS — E7800 Pure hypercholesterolemia, unspecified: Secondary | ICD-10-CM | POA: Diagnosis not present

## 2015-03-19 DIAGNOSIS — Z7952 Long term (current) use of systemic steroids: Secondary | ICD-10-CM | POA: Diagnosis not present

## 2015-03-19 DIAGNOSIS — Z79899 Other long term (current) drug therapy: Secondary | ICD-10-CM | POA: Diagnosis not present

## 2015-03-19 DIAGNOSIS — Y998 Other external cause status: Secondary | ICD-10-CM | POA: Diagnosis not present

## 2015-03-19 DIAGNOSIS — S92502A Displaced unspecified fracture of left lesser toe(s), initial encounter for closed fracture: Secondary | ICD-10-CM

## 2015-03-19 LAB — URINALYSIS, ROUTINE W REFLEX MICROSCOPIC
BILIRUBIN URINE: NEGATIVE
Glucose, UA: NEGATIVE mg/dL
Hgb urine dipstick: NEGATIVE
KETONES UR: NEGATIVE mg/dL
LEUKOCYTES UA: NEGATIVE
NITRITE: NEGATIVE
Protein, ur: NEGATIVE mg/dL
SPECIFIC GRAVITY, URINE: 1.005 (ref 1.005–1.030)
pH: 7 (ref 5.0–8.0)

## 2015-03-19 LAB — CBC WITH DIFFERENTIAL/PLATELET
BASOS PCT: 0 %
Basophils Absolute: 0 10*3/uL (ref 0.0–0.1)
EOS ABS: 0.2 10*3/uL (ref 0.0–0.7)
Eosinophils Relative: 3 %
HCT: 30.6 % — ABNORMAL LOW (ref 36.0–46.0)
HEMOGLOBIN: 9.6 g/dL — AB (ref 12.0–15.0)
LYMPHS ABS: 1.8 10*3/uL (ref 0.7–4.0)
Lymphocytes Relative: 25 %
MCH: 30.1 pg (ref 26.0–34.0)
MCHC: 31.4 g/dL (ref 30.0–36.0)
MCV: 95.9 fL (ref 78.0–100.0)
Monocytes Absolute: 0.6 10*3/uL (ref 0.1–1.0)
Monocytes Relative: 9 %
NEUTROS ABS: 4.4 10*3/uL (ref 1.7–7.7)
NEUTROS PCT: 63 %
Platelets: 250 10*3/uL (ref 150–400)
RBC: 3.19 MIL/uL — AB (ref 3.87–5.11)
RDW: 15.6 % — ABNORMAL HIGH (ref 11.5–15.5)
WBC: 7 10*3/uL (ref 4.0–10.5)

## 2015-03-19 LAB — BASIC METABOLIC PANEL
ANION GAP: 4 — AB (ref 5–15)
BUN: 9 mg/dL (ref 6–20)
CHLORIDE: 110 mmol/L (ref 101–111)
CO2: 26 mmol/L (ref 22–32)
CREATININE: 0.73 mg/dL (ref 0.44–1.00)
Calcium: 8.5 mg/dL — ABNORMAL LOW (ref 8.9–10.3)
GFR calc non Af Amer: >60 mL/min (ref >60)
Glucose, Bld: 98 mg/dL (ref 65–99)
Potassium: 3.4 mmol/L — ABNORMAL LOW (ref 3.5–5.1)
SODIUM: 140 mmol/L (ref 135–145)

## 2015-03-19 MED ORDER — MORPHINE SULFATE (PF) 4 MG/ML IV SOLN
4.0000 mg | Freq: Once | INTRAVENOUS | Status: AC
  Administered 2015-03-19: 4 mg via INTRAVENOUS
  Filled 2015-03-19: qty 1

## 2015-03-19 MED ORDER — SODIUM CHLORIDE 0.9 % IV BOLUS (SEPSIS)
1000.0000 mL | Freq: Once | INTRAVENOUS | Status: AC
  Administered 2015-03-19: 1000 mL via INTRAVENOUS

## 2015-03-19 MED ORDER — ASPIRIN 81 MG PO CHEW: 324.0000 mg | CHEWABLE_TABLET | Freq: Once | ORAL | Status: DC

## 2015-03-19 MED ORDER — BUPIVACAINE HCL (PF) 0.5 % IJ SOLN
20.0000 mL | Freq: Once | INTRAMUSCULAR | Status: AC
  Administered 2015-03-19: 10 mL
  Filled 2015-03-19: qty 20

## 2015-03-19 MED ORDER — ACETAMINOPHEN 500 MG PO TABS: 1000.0000 mg | ORAL_TABLET | Freq: Once | ORAL | Status: DC

## 2015-03-19 NOTE — ED Provider Notes (Signed)
ED ECG REPORT   Date: 03/19/2015  Rate: 74  Rhythm: normal sinus rhythm  QRS Axis: normal  Intervals: normal  ST/T Wave abnormalities: normal  Conduction Disutrbances:none  Narrative Interpretation:   Old EKG Reviewed: none available  I have personally reviewed the EKG tracing and agree with the computerized printout as noted.   Melene Plan, DO 03/20/15 [phone]

## 2015-03-19 NOTE — ED Provider Notes (Signed)
CSN: [id number]     Arrival date & time 03/19/15  1817 History   First MD Initiated Contact with Patient 03/19/15 1831     Chief Complaint  Patient presents with  . Near Syncope     (Consider location/radiation/quality/duration/timing/severity/associated sxs/prior Treatment) HPI Patient presents to the emergency department with a syncopal episode that occurred just prior to arrival.  The patient states that she had vomiting and diarrhea yesterday and then she felt somewhat dizzy yesterday but then today she had a dizzy episode when she stood up she states that her in the past.  The patient states that she has not had any nausea or vomiting today.  She states that she felt dizzy and fell having a syncopal episode.  She states she does have right fifth toe pain as well.  Patient states she does not have any chest pain, shortness breath, nausea, vomiting, weakness,headache, blurred vision, fever, cough, dysuria, incontinence, hematemesis, bloody stool or rash.  The patient states nothing seems make her condition better or worse, states she feels better at this time Past Medical History  Diagnosis Date  . Migraine Dx 2008  . Arthritis Dx 2003  . Hypertension Dx 2008  . Rheumatoid arthritis ([place]) Dx 2003  . Hypercholesteremia Dx 2012  . Lupus ([place]) Dx 2001  . Depression Dx 2001  . Type 2 diabetes mellitus without complication ([place]) Dx 9166  . Lupus ([place])   . RA (rheumatoid arthritis) ([place])   . Diabetes mellitus without complication ([place])   . Hypertension   . Hyperlipidemia   . Major depression [facility])    Past Surgical History  Procedure Laterality Date  . Back surgery    . Abdominal hysterectomy    . Back surgery  2004  . Cesarean section      1981 1983 1991   Family History  Problem Relation Age of Onset  . Hypertension Mother   . Diabetes Mother   . Cancer Mother   . Cancer Father   . Hypertension Sister   . Diabetes Sister    Social History  Substance Use Topics  .  Smoking status: Current Every Day Smoker -- 0.50 packs/day    Types: Cigarettes  . Smokeless tobacco: None  . Alcohol Use: No   OB History    Gravida Para Term Preterm AB TAB SAB Ectopic Multiple Living   0 0 0 0 0 0 0 0       Review of Systems  All other systems negative except as documented in the HPI. All pertinent positives and negatives as reviewed in the HPI.   Allergies  Naproxen; Naproxen; Sulfa antibiotics; and Sulfa antibiotics  Home Medications   Prior to Admission medications   Medication Sig Start Date End Date Taking? Authorizing Provider  acetaminophen-codeine (TYLENOL #3) 300-30 MG per tablet Take 1 tablet by mouth every 8 (eight) hours as needed for moderate pain. 08/22/14   Josalyn Funches, MD  amLODipine (NORVASC) 5 MG tablet Take 1 tablet (5 mg total) by mouth daily. 08/22/14   Josalyn Funches, MD  atorvastatin (LIPITOR) 20 MG tablet Take 1 tablet (20 mg total) by mouth daily. 08/22/14   Josalyn Funches, MD  Blood Glucose Monitoring Suppl (TRUERESULT BLOOD GLUCOSE) W/DEVICE KIT 1 each by Does not apply route 3 (three) times daily before meals. 08/22/14   Josalyn Funches, MD  cyclobenzaprine (FLEXERIL) 5 MG tablet Take 1 tablet (5 mg total) by mouth 3 (three) times daily as needed for muscle spasms. 11/10/14  Glendell Docker, NP  docusate sodium (COLACE) 100 MG capsule Take 100 mg by mouth 2 (two) times daily.    Historical Provider, MD  DULoxetine (CYMBALTA) 60 MG capsule Take 1 capsule (60 mg total) by mouth daily. 08/22/14   Josalyn Funches, MD  folic acid (FOLVITE) 1 MG tablet Take 1 mg by mouth daily.    Historical Provider, MD  glucose blood (TRUETEST TEST) test strip 1 each by Other route 3 (three) times daily. Use as instructed 08/22/14   Boykin Nearing, MD  HYDROcodone-acetaminophen (NORCO/VICODIN) 5-325 MG per tablet Take 1-2 tablets by mouth every 6 (six) hours as needed. 11/10/14   Glendell Docker, NP  hydroxychloroquine (PLAQUENIL) 200 MG tablet Take 200 mg  by mouth 2 (two) times daily.    Historical Provider, MD  Insulin Glargine (LANTUS SOLOSTAR) 100 UNIT/ML Solostar Pen Inject 10 Units into the skin daily at 10 pm. 08/22/14   Josalyn Funches, MD  Insulin Pen Needle (B-D ULTRAFINE III SHORT PEN) 31G X 8 MM MISC 1 application by Does not apply route daily. 08/22/14   Josalyn Funches, MD  lisinopril-hydrochlorothiazide (PRINZIDE,ZESTORETIC) 20-12.5 MG per tablet Take 2 tablets by mouth daily. 08/22/14   Josalyn Funches, MD  methotrexate (RHEUMATREX) 2.5 MG tablet Take 10 mg by mouth every 7 (seven) days. Monday    Historical Provider, MD  mirtazapine (REMERON) 7.5 MG tablet Take 1 tablet (7.5 mg total) by mouth at bedtime. 08/22/14   Josalyn Funches, MD  predniSONE (DELTASONE) 10 MG tablet Take 1 tablet (10 mg total) by mouth daily with breakfast. 08/22/14   Boykin Nearing, MD  TRUEPLUS LANCETS 28G MISC 1 each by Does not apply route 3 (three) times daily. 08/22/14   Josalyn Funches, MD   BP 84/58 mmHg  Pulse 75  Temp(Src) 98.6 F (37 C) (Oral)  Resp 13  Ht '5\' 5"'  (1.651 m)  Wt 162 lb (73.483 kg)  BMI 26.96 kg/m2  SpO2 97%  LMP 08/21/2014 Physical Exam  Constitutional: She is oriented to person, place, and time. She appears well-developed and well-nourished. No distress.  HENT:  Head: Normocephalic and atraumatic.  Mouth/Throat: Oropharynx is clear and moist.  Eyes: Pupils are equal, round, and reactive to light.  Neck: Normal range of motion. Neck supple.  Cardiovascular: Normal rate, regular rhythm and normal heart sounds.  Exam reveals no gallop and no friction rub.   No murmur heard. Pulmonary/Chest: Effort normal and breath sounds normal. No respiratory distress. She has no wheezes.  Abdominal: Soft. Bowel sounds are normal. She exhibits no distension. There is no tenderness.  Musculoskeletal:       Feet:  Neurological: She is alert and oriented to person, place, and time. She exhibits normal muscle tone. Coordination normal.  Skin:  Skin is warm and dry. No rash noted. No erythema.  Psychiatric: She has a normal mood and affect. Her behavior is normal.  Nursing note and vitals reviewed.   ED Course  Reduction of fracture Date/Time: 03/20/2015 12:52 AM Performed by: Dalia Heading Authorized by: Dalia Heading Consent: Verbal consent obtained. Written consent not obtained. Risks and benefits: risks, benefits and alternatives were discussed Consent given by: patient Patient understanding: patient states understanding of the procedure being performed Imaging studies: imaging studies available Patient identity confirmed: verbally with patient Time out: Immediately prior to procedure a "time out" was called to verify the correct patient, procedure, equipment, support staff and site/side marked as required. Preparation: Patient was prepped and draped in the usual sterile fashion. Local  anesthesia used: yes Anesthesia: local infiltration and digital block Local anesthetic: bupivacaine 0.5% without epinephrine Anesthetic total: 4 ml Patient sedated: no Patient tolerance: Patient tolerated the procedure well with no immediate complications Comments: There was not a successful reduction of the fracture.  This was made difficult by the fact this is the fifth toe and was not able to fully reduce the fracture   We will buddy tape the toe and  place in a postop shoe and have her follow-up with her orthopedist   (including critical care time) Labs Review Labs Reviewed  BASIC METABOLIC PANEL  URINALYSIS, ROUTINE W REFLEX MICROSCOPIC (NOT AT [facility])  CBC WITH DIFFERENTIAL/PLATELET    Imaging Review Dg Toe 5th Right  03/20/2015  CLINICAL DATA:  Post reduction. EXAM: RIGHT FIFTH TOE COMPARISON:  RIGHT toe radiographs March 19, 2015 FINDINGS: Transverse fracture through the fifth proximal phalanx diaphysis without intra-articular extension, similar overriding bony fragments with lateral angulation of distal bony  fragments, similar. No dislocation. No destructive bony lesions. Soft tissue planes are nonsuspicious. IMPRESSION: Similarly displaced fifth proximal phalanx fracture without dislocation. Electronically Signed   By: Elon Alas M.D.   On: 03/20/2015 00:45   Dg Toe 5th Right  03/20/2015  CLINICAL DATA:  Fracture, attempted reduction EXAM: RIGHT FIFTH TOE COMPARISON:  Earlier films of the same day FINDINGS: Transverse fracture of the proximal phalanx right little toe with greater than shaft width medial displacement and lateral angulation of distal fracture fragment and several mm override. No new bone abnormality identified. IMPRESSION: 1. Little toe proximal phalanx displaced fracture, little changed from previous exam Electronically Signed   By: Lucrezia Europe M.D.   On: 03/20/2015 00:00   Dg Toe 5th Right  03/19/2015  CLINICAL DATA:  Right fifth toe pain after fall today. Initial encounter. EXAM: RIGHT FIFTH TOE COMPARISON:  October 13, 2008. FINDINGS: Severely displaced and angulated fracture is seen involving the fifth proximal phalanx. This appears to be closed and posttraumatic. Joint spaces are intact. No soft tissue abnormality is noted. IMPRESSION: Severely displaced and angulated fifth proximal phalangeal fracture. Electronically Signed   By: Marijo Conception, M.D.   On: 03/19/2015 20:48   I have personally reviewed and evaluated these images and lab results as part of my medical decision-making.  I was unable to reduce the patient's fracture.  The patient has seen an orthopedist.  I will refer her back to them.  She is placed in a postop shoe and buddy tape and crutches.  Patient most likely was dehydrated from the vomiting and nausea along with the diarrhea yesterday.  Given her fluids and she is feeling better at this time     Dalia Heading, PA-C 03/20/15 [place], PA-C 03/20/15 [place], DO 03/20/15 [phone]

## 2015-03-19 NOTE — ED Notes (Signed)
CBG per ems 142.

## 2015-03-19 NOTE — ED Notes (Signed)
Pt. Had a near syncopal episode and fell to her knees.  She has abrasions to her knees.  She Denies any chest pain or sob.  Yesterday she had nausea and vomited through out the day. Today she has not vomited but is nauseated Initital was 70/40 sitting  Pt. Has received 600 fluids and bp is 86.  Alert and oriented X4.   Did not hit her head. NSR on 12 Lead.

## 2015-03-20 ENCOUNTER — Emergency Department (HOSPITAL_COMMUNITY): Payer: Commercial Managed Care - HMO

## 2015-03-20 MED ORDER — OXYCODONE-ACETAMINOPHEN 5-325 MG PO TABS
1.0000 | ORAL_TABLET | Freq: Once | ORAL | Status: AC
  Administered 2015-03-20: 1 via ORAL
  Filled 2015-03-20: qty 1

## 2015-03-20 MED ORDER — HYDROCODONE-ACETAMINOPHEN 5-325 MG PO TABS: 1.0000 | ORAL_TABLET | Freq: Four times a day (QID) | ORAL | Status: DC | PRN

## 2015-03-20 NOTE — ED Notes (Signed)
Patient transported to X-ray 

## 2015-03-20 NOTE — ED Notes (Signed)
Pt given bus pass ?

## 2015-03-20 NOTE — Discharge Instructions (Signed)
Return here as needed.  Follow-up with your orthopedist.  Increase your fluid intake.  Ice and elevate your foot

## 2015-10-10 ENCOUNTER — Emergency Department (HOSPITAL_COMMUNITY)
Admission: EM | Admit: 2015-10-10 | Discharge: 2015-10-10 | Disposition: A | Discharge status: Home / Self Care | Payer: Medicare Other | Attending: Emergency Medicine | Admitting: Emergency Medicine

## 2015-10-10 ENCOUNTER — Encounter (HOSPITAL_COMMUNITY): Payer: Self-pay | Admitting: *Deleted

## 2015-10-10 DIAGNOSIS — E119 Type 2 diabetes mellitus without complications: Secondary | ICD-10-CM | POA: Diagnosis not present

## 2015-10-10 DIAGNOSIS — G8929 Other chronic pain: Secondary | ICD-10-CM | POA: Diagnosis not present

## 2015-10-10 DIAGNOSIS — Z794 Long term (current) use of insulin: Secondary | ICD-10-CM | POA: Insufficient documentation

## 2015-10-10 DIAGNOSIS — S20469A Insect bite (nonvenomous) of unspecified back wall of thorax, initial encounter: Secondary | ICD-10-CM | POA: Diagnosis not present

## 2015-10-10 DIAGNOSIS — R112 Nausea with vomiting, unspecified: Secondary | ICD-10-CM | POA: Diagnosis not present

## 2015-10-10 DIAGNOSIS — R51 Headache: Secondary | ICD-10-CM | POA: Insufficient documentation

## 2015-10-10 DIAGNOSIS — I10 Essential (primary) hypertension: Secondary | ICD-10-CM | POA: Diagnosis not present

## 2015-10-10 DIAGNOSIS — Y929 Unspecified place or not applicable: Secondary | ICD-10-CM | POA: Insufficient documentation

## 2015-10-10 DIAGNOSIS — W57XXXA Bitten or stung by nonvenomous insect and other nonvenomous arthropods, initial encounter: Secondary | ICD-10-CM | POA: Diagnosis not present

## 2015-10-10 DIAGNOSIS — F17210 Nicotine dependence, cigarettes, uncomplicated: Secondary | ICD-10-CM | POA: Insufficient documentation

## 2015-10-10 DIAGNOSIS — Y999 Unspecified external cause status: Secondary | ICD-10-CM | POA: Insufficient documentation

## 2015-10-10 DIAGNOSIS — Y939 Activity, unspecified: Secondary | ICD-10-CM | POA: Insufficient documentation

## 2015-10-10 DIAGNOSIS — Z760 Encounter for issue of repeat prescription: Secondary | ICD-10-CM | POA: Insufficient documentation

## 2015-10-10 DIAGNOSIS — Z79899 Other long term (current) drug therapy: Secondary | ICD-10-CM | POA: Diagnosis not present

## 2015-10-10 DIAGNOSIS — R531 Weakness: Secondary | ICD-10-CM | POA: Insufficient documentation

## 2015-10-10 HISTORY — DX: Chronic pain syndrome: G89.4

## 2015-10-10 MED ORDER — DOXYCYCLINE HYCLATE 100 MG PO TABS
100.0000 mg | ORAL_TABLET | Freq: Once | ORAL | Status: AC
  Administered 2015-10-10: 100 mg via ORAL
  Filled 2015-10-10: qty 1

## 2015-10-10 MED ORDER — OXYCODONE-ACETAMINOPHEN 5-325 MG PO TABS: ORAL_TABLET | ORAL | Status: DC

## 2015-10-10 MED ORDER — DOXYCYCLINE HYCLATE 100 MG PO TABS: 100.0000 mg | ORAL_TABLET | Freq: Two times a day (BID) | ORAL | Status: DC

## 2015-10-10 NOTE — Discharge Instructions (Signed)
Take the prescriptions as directed.  Call your regular medical doctor today to schedule a follow up appointment within the next 3 days.  Return to the Emergency Department immediately sooner if worsening.  ° °

## 2015-10-10 NOTE — ED Provider Notes (Signed)
CSN: [id number]     Arrival date & time 10/10/15  1242 History   First MD Initiated Contact with Patient 10/10/15 1457     Chief Complaint  Patient presents with  . Headache  . Emesis  . Joint Pain  . Weakness     HPI Pt was seen at 1505. Per pt, c/o gradual onset and persistence of constant multiple symptoms for the past 2 days. Pt states she "pulled 2 ticks off of her" last week. Symptoms include: acute flair of her chronic headache, generalized body aches, and a few episodes of N/V. Pt also states she has "run out of" her chronic narcotic pain medication and is requesting a refill "to get me through until Monday." Describes the headache as per her usual chronic headache pain pattern.  Denies headache was sudden or maximal in onset or at any time.  Denies visual changes, no focal motor weakness, no tingling/numbness in extremities, no fevers, no neck pain, no rash, no CP/SOB, no abd pain, no diarrhea, no black or blood in stools or emesis.     Past Medical History  Diagnosis Date  . Migraine Dx 2008  . Arthritis Dx 2003  . Hypertension Dx 2008  . Rheumatoid arthritis (HCC) Dx 2003  . Hypercholesteremia Dx 2012  . Lupus (HCC) Dx 2001  . Depression Dx 2001  . Type 2 diabetes mellitus without complication (HCC) Dx 2015  . RA (rheumatoid arthritis) (HCC)   . Diabetes mellitus without complication (HCC)   . Hypertension   . Hyperlipidemia   . Major depression (HCC)   . Chronic pain syndrome    Past Surgical History  Procedure Laterality Date  . Back surgery    . Abdominal hysterectomy    . Back surgery  2004  . Cesarean section      1981 1983 1991   Family History  Problem Relation Age of Onset  . Hypertension Mother   . Diabetes Mother   . Cancer Mother   . Cancer Father   . Hypertension Sister   . Diabetes Sister    Social History  Substance Use Topics  . Smoking status: Current Every Day Smoker -- 0.50 packs/day    Types: Cigarettes  . Smokeless tobacco: None  .  Alcohol Use: No   OB History    Gravida Para Term Preterm AB TAB SAB Ectopic Multiple Living   0 0 0 0 0 0 0 0       Review of Systems ROS: Statement: All systems negative except as marked or noted in the HPI; Constitutional: Negative for fever and chills. +body aches. ; ; Eyes: Negative for eye pain, redness and discharge. ; ; ENMT: Negative for ear pain, hoarseness, nasal congestion, sinus pressure and sore throat. ; ; Cardiovascular: Negative for chest pain, palpitations, diaphoresis, dyspnea and peripheral edema. ; ; Respiratory: Negative for cough, wheezing and stridor. ; ; Gastrointestinal: +N/V. Negative for diarrhea, abdominal pain, blood in stool, hematemesis, jaundice and rectal bleeding. . ; ; Genitourinary: Negative for dysuria, flank pain and hematuria. ; ; Musculoskeletal: Negative for back pain and neck pain. Negative for swelling and trauma.; ; Skin: Negative for pruritus, rash, abrasions, blisters, bruising and skin lesion.; ; Neuro: +headache. Negative for lightheadedness and neck stiffness. Negative for weakness, altered level of consciousness, altered mental status, extremity weakness, paresthesias, involuntary movement, seizure and syncope.      Allergies  Naproxen and Sulfa antibiotics  Home Medications   Prior to Admission medications   Medication  Sig Start Date End Date Taking? Authorizing Provider  amLODipine (NORVASC) 5 MG tablet Take 1 tablet (5 mg total) by mouth daily. Patient taking differently: Take 5 mg by mouth daily as needed (high blood pressure >150/60).  08/22/14   Dessa Phi, MD  atorvastatin (LIPITOR) 20 MG tablet Take 1 tablet (20 mg total) by mouth daily. 08/22/14   Josalyn Funches, MD  docusate sodium (COLACE) 100 MG capsule Take 100 mg by mouth 2 (two) times daily as needed for mild constipation.     Historical Provider, MD  DULoxetine (CYMBALTA) 60 MG capsule Take 1 capsule (60 mg total) by mouth daily. 08/22/14   Josalyn Funches, MD  folic acid  (FOLVITE) 1 MG tablet Take 1 mg by mouth daily.    Historical Provider, MD  HYDROcodone-acetaminophen (NORCO/VICODIN) 5-325 MG tablet Take 1 tablet by mouth every 6 (six) hours as needed for moderate pain. 03/20/15   Charlestine Night, PA-C  hydroxychloroquine (PLAQUENIL) 200 MG tablet Take 200 mg by mouth 2 (two) times daily.    Historical Provider, MD  Insulin Glargine (LANTUS SOLOSTAR) 100 UNIT/ML Solostar Pen Inject 10 Units into the skin daily at 10 pm. Patient taking differently: Inject 10 Units into the skin daily.  08/22/14   Josalyn Funches, MD  lisinopril-hydrochlorothiazide (PRINZIDE,ZESTORETIC) 20-12.5 MG per tablet Take 2 tablets by mouth daily. 08/22/14   Josalyn Funches, MD  predniSONE (DELTASONE) 10 MG tablet Take 1 tablet (10 mg total) by mouth daily with breakfast. 08/22/14   Josalyn Funches, MD   BP 154/114 mmHg  Pulse 103  Temp(Src) 98.1 F (36.7 C) (Oral)  Resp 14  SpO2 100%  LMP 08/21/2014 Physical Exam  1510: Physical examination:  Nursing notes reviewed; Vital signs and O2 SAT reviewed;  Constitutional: Well developed, Well nourished, Well hydrated, In no acute distress; Head:  Normocephalic, atraumatic; Eyes: EOMI, PERRL, No scleral icterus; ENMT: Mouth and pharynx normal, Mucous membranes moist; Neck: Supple, No meningeal signs. Full range of motion, No lymphadenopathy; Cardiovascular: Regular rate and rhythm, No gallop; Respiratory: Breath sounds clear & equal bilaterally, No wheezes.  Speaking full sentences with ease, Normal respiratory effort/excursion; Chest: Nontender, Movement normal; Abdomen: Soft, Nontender, Nondistended, Normal bowel sounds; Genitourinary: No CVA tenderness; Spine:  No midline CS, TS, LS tenderness.;; Extremities: Pulses normal, No tenderness, No edema, No calf edema or asymmetry.; Neuro: AA&Ox3, Major CN grossly intact. No facial droop. Speech clear. No gross focal motor or sensory deficits in extremities. Climbs on and off stretcher easily by  herself. Gait steady.; Skin: Color normal, Warm, Dry. +very small healing tick bite sites right back and right anterior tibial area without obvious FB, erythema, ecchymosis, edema.     ED Course  Procedures (including critical care time) Labs Review  Imaging Review  I have personally reviewed and evaluated these images and lab results as part of my medical decision-making.   EKG Interpretation None      MDM  MDM Reviewed: nursing note, vitals and previous chart     1515:  Given pt's symptoms and s/p tick bite, will rx doxycycline. Pt also requesting "some pain meds until Monday when I see my doctor." [place] Controlled Substance Database accessed: in the past 5 months, pt has been receiving oxycodone 10/APAP 325mg , #120, prescriptions, these rx have written by 3 different doctors in 3 different medical practices and filled at 2 different pharmacies, with last rx was filled on 09/10/15. Pt informed she will receive a very limited supply of narcotic medications, as the ED  is not the proper venue to fill these prescriptions; Pt verb understanding. Pt has tol PO well while in the ED without N/V. Dx d/w pt.  Questions answered.  Verb understanding, agreeable to d/c home with outpt f/u.   Samuel Jester, DO 10/13/15 1555

## 2015-10-10 NOTE — ED Notes (Signed)
Pt presents via POV with multiple complaiints-tick bites x 2 to back, joint pain, weakness, headache, emesis.  Reports x 2 days.  Ticks were found on pt last week, symptoms started 2 days ago.  A x 4, NAD.  Hx: lupus, DM, Migraine, HTN.

## 2015-10-10 NOTE — ED Notes (Signed)
Pt A&Ox4, ambulatory at d/c with steady gait, NAD 

## 2015-10-13 DIAGNOSIS — I10 Essential (primary) hypertension: Secondary | ICD-10-CM | POA: Diagnosis not present

## 2015-10-13 DIAGNOSIS — Z0000 Encounter for general adult medical examination without abnormal findings: Secondary | ICD-10-CM | POA: Diagnosis not present

## 2015-10-13 DIAGNOSIS — M329 Systemic lupus erythematosus, unspecified: Secondary | ICD-10-CM | POA: Diagnosis not present

## 2015-10-13 DIAGNOSIS — E119 Type 2 diabetes mellitus without complications: Secondary | ICD-10-CM | POA: Diagnosis not present

## 2015-10-27 DIAGNOSIS — I00 Rheumatic fever without heart involvement: Secondary | ICD-10-CM | POA: Diagnosis not present

## 2015-10-27 DIAGNOSIS — I10 Essential (primary) hypertension: Secondary | ICD-10-CM | POA: Diagnosis not present

## 2015-10-27 DIAGNOSIS — E559 Vitamin D deficiency, unspecified: Secondary | ICD-10-CM | POA: Diagnosis not present

## 2015-10-27 DIAGNOSIS — E119 Type 2 diabetes mellitus without complications: Secondary | ICD-10-CM | POA: Diagnosis not present

## 2015-11-06 DIAGNOSIS — K219 Gastro-esophageal reflux disease without esophagitis: Secondary | ICD-10-CM | POA: Diagnosis not present

## 2015-11-06 DIAGNOSIS — E559 Vitamin D deficiency, unspecified: Secondary | ICD-10-CM | POA: Diagnosis not present

## 2015-11-06 DIAGNOSIS — I00 Rheumatic fever without heart involvement: Secondary | ICD-10-CM | POA: Diagnosis not present

## 2015-11-06 DIAGNOSIS — E785 Hyperlipidemia, unspecified: Secondary | ICD-10-CM | POA: Diagnosis not present

## 2015-12-09 DIAGNOSIS — M545 Low back pain: Secondary | ICD-10-CM | POA: Diagnosis not present

## 2015-12-09 DIAGNOSIS — M25511 Pain in right shoulder: Secondary | ICD-10-CM | POA: Diagnosis not present

## 2015-12-09 DIAGNOSIS — E119 Type 2 diabetes mellitus without complications: Secondary | ICD-10-CM | POA: Diagnosis not present

## 2015-12-09 DIAGNOSIS — M329 Systemic lupus erythematosus, unspecified: Secondary | ICD-10-CM | POA: Diagnosis not present

## 2015-12-09 DIAGNOSIS — I00 Rheumatic fever without heart involvement: Secondary | ICD-10-CM | POA: Diagnosis not present

## 2016-02-24 ENCOUNTER — Emergency Department (HOSPITAL_COMMUNITY)
Admission: EM | Admit: 2016-02-24 | Discharge: 2016-02-25 | Disposition: A | Discharge status: Home / Self Care | Payer: Medicare Other | Attending: Emergency Medicine | Admitting: Emergency Medicine

## 2016-02-24 DIAGNOSIS — Y929 Unspecified place or not applicable: Secondary | ICD-10-CM | POA: Insufficient documentation

## 2016-02-24 DIAGNOSIS — Z79899 Other long term (current) drug therapy: Secondary | ICD-10-CM | POA: Insufficient documentation

## 2016-02-24 DIAGNOSIS — Y990 Civilian activity done for income or pay: Secondary | ICD-10-CM | POA: Insufficient documentation

## 2016-02-24 DIAGNOSIS — M545 Low back pain: Secondary | ICD-10-CM | POA: Diagnosis present

## 2016-02-24 DIAGNOSIS — Y93F2 Activity, caregiving, lifting: Secondary | ICD-10-CM | POA: Diagnosis not present

## 2016-02-24 DIAGNOSIS — I10 Essential (primary) hypertension: Secondary | ICD-10-CM | POA: Insufficient documentation

## 2016-02-24 DIAGNOSIS — E119 Type 2 diabetes mellitus without complications: Secondary | ICD-10-CM | POA: Diagnosis not present

## 2016-02-24 DIAGNOSIS — F17210 Nicotine dependence, cigarettes, uncomplicated: Secondary | ICD-10-CM | POA: Insufficient documentation

## 2016-02-24 DIAGNOSIS — X500XXA Overexertion from strenuous movement or load, initial encounter: Secondary | ICD-10-CM | POA: Insufficient documentation

## 2016-02-24 DIAGNOSIS — M5441 Lumbago with sciatica, right side: Secondary | ICD-10-CM | POA: Diagnosis not present

## 2016-02-24 DIAGNOSIS — Z794 Long term (current) use of insulin: Secondary | ICD-10-CM | POA: Insufficient documentation

## 2016-02-24 MED ORDER — DIAZEPAM 5 MG/ML IJ SOLN
5.0000 mg | Freq: Once | INTRAMUSCULAR | Status: AC
  Administered 2016-02-24: 5 mg via INTRAMUSCULAR
  Filled 2016-02-24: qty 2

## 2016-02-24 NOTE — ED Triage Notes (Signed)
Pt states that she overworked herself two days ago at work and feels like she is having a lupus flare up with pain all over body, specifically in her pelvis and back. Alert and oriented.
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
## 2016-02-25 DIAGNOSIS — M5441 Lumbago with sciatica, right side: Secondary | ICD-10-CM | POA: Diagnosis not present

## 2016-02-25 LAB — URINALYSIS, ROUTINE W REFLEX MICROSCOPIC
BILIRUBIN URINE: NEGATIVE
GLUCOSE, UA: NEGATIVE mg/dL
HGB URINE DIPSTICK: NEGATIVE
Ketones, ur: NEGATIVE mg/dL
Leukocytes, UA: NEGATIVE
Nitrite: NEGATIVE
Protein, ur: NEGATIVE mg/dL
SPECIFIC GRAVITY, URINE: 1.013 (ref 1.005–1.030)
pH: 7 (ref 5.0–8.0)

## 2016-02-25 MED ORDER — METHOCARBAMOL 500 MG PO TABS: 500.0000 mg | ORAL_TABLET | Freq: Two times a day (BID) | ORAL | 0 refills | Status: DC

## 2016-02-25 MED ORDER — METHOCARBAMOL 500 MG PO TABS
1000.0000 mg | ORAL_TABLET | Freq: Once | ORAL | Status: AC
  Administered 2016-02-25: 1000 mg via ORAL
  Filled 2016-02-25: qty 2

## 2016-02-25 NOTE — Discharge Instructions (Signed)
Please read and follow all provided instructions.  Your diagnoses today include:  1. Acute right-sided low back pain with right-sided sciatica     Tests performed today include: Vital signs - see below for your results today  Medications prescribed:   Take any prescribed medications only as directed.  Home care instructions:  Follow any educational materials contained in this packet Please rest, use ice or heat on your back for the next several days Do not lift, push, pull anything more than 10 pounds for the next week  Follow-up instructions: Please follow-up with your primary care provider in the next 1 week for further evaluation of your symptoms.   Return instructions:  SEEK IMMEDIATE MEDICAL ATTENTION IF YOU HAVE: New numbness, tingling, weakness, or problem with the use of your arms or legs Severe back pain not relieved with medications Loss control of your bowels or bladder Increasing pain in any areas of the body (such as chest or abdominal pain) Shortness of breath, dizziness, or fainting.  Worsening nausea (feeling sick to your stomach), vomiting, fever, or sweats Any other emergent concerns regarding your health   Additional Information:  Your vital signs today were: BP 139/78 (BP Location: Left Arm)    Pulse 96    Temp 98.8 F (37.1 C) (Oral)    Resp 22    Ht 5\' 5"  (1.651 m)    Wt 63.5 kg    LMP 08/21/2014    SpO2 98%    BMI 23.30 kg/m  If your blood pressure (BP) was elevated above 135/85 this visit, please have this repeated by your doctor within one month. --------------

## 2016-02-25 NOTE — ED Notes (Signed)
Pt given discharge instructions, verbalized understanding of need to follow up, reasons to return to the ED and medications to take at home for continued pain. Pt denied further questions or concerns. Pt able to transfer to wheelchair and was escorted to lobby without difficulty. Pt noted to ambulate from wheelchair to exit without difficulty, with cane.

## 2016-04-13 DIAGNOSIS — E119 Type 2 diabetes mellitus without complications: Secondary | ICD-10-CM | POA: Diagnosis not present

## 2016-04-13 DIAGNOSIS — M329 Systemic lupus erythematosus, unspecified: Secondary | ICD-10-CM | POA: Diagnosis not present

## 2016-05-10 DIAGNOSIS — I10 Essential (primary) hypertension: Secondary | ICD-10-CM | POA: Diagnosis not present

## 2016-05-10 DIAGNOSIS — R739 Hyperglycemia, unspecified: Secondary | ICD-10-CM | POA: Diagnosis not present

## 2016-05-10 DIAGNOSIS — I00 Rheumatic fever without heart involvement: Secondary | ICD-10-CM | POA: Diagnosis not present

## 2016-05-10 DIAGNOSIS — M329 Systemic lupus erythematosus, unspecified: Secondary | ICD-10-CM | POA: Diagnosis not present

## 2016-05-18 DIAGNOSIS — Z79899 Other long term (current) drug therapy: Secondary | ICD-10-CM | POA: Diagnosis not present

## 2016-05-18 DIAGNOSIS — E559 Vitamin D deficiency, unspecified: Secondary | ICD-10-CM | POA: Diagnosis not present

## 2016-05-18 DIAGNOSIS — M3500 Sicca syndrome, unspecified: Secondary | ICD-10-CM | POA: Diagnosis not present

## 2016-05-18 DIAGNOSIS — M329 Systemic lupus erythematosus, unspecified: Secondary | ICD-10-CM | POA: Diagnosis not present

## 2016-05-18 DIAGNOSIS — M79643 Pain in unspecified hand: Secondary | ICD-10-CM | POA: Diagnosis not present

## 2016-05-18 DIAGNOSIS — Z8739 Personal history of other diseases of the musculoskeletal system and connective tissue: Secondary | ICD-10-CM | POA: Diagnosis not present

## 2016-05-18 DIAGNOSIS — Z13828 Encounter for screening for other musculoskeletal disorder: Secondary | ICD-10-CM | POA: Diagnosis not present

## 2016-05-18 DIAGNOSIS — M064 Inflammatory polyarthropathy: Secondary | ICD-10-CM | POA: Diagnosis not present

## 2016-06-10 DIAGNOSIS — M25511 Pain in right shoulder: Secondary | ICD-10-CM | POA: Diagnosis not present

## 2016-06-10 DIAGNOSIS — I00 Rheumatic fever without heart involvement: Secondary | ICD-10-CM | POA: Diagnosis not present

## 2016-06-10 DIAGNOSIS — R21 Rash and other nonspecific skin eruption: Secondary | ICD-10-CM | POA: Diagnosis not present

## 2016-06-10 DIAGNOSIS — M064 Inflammatory polyarthropathy: Secondary | ICD-10-CM | POA: Diagnosis not present

## 2016-06-10 DIAGNOSIS — Z79899 Other long term (current) drug therapy: Secondary | ICD-10-CM | POA: Diagnosis not present

## 2016-06-10 DIAGNOSIS — M329 Systemic lupus erythematosus, unspecified: Secondary | ICD-10-CM | POA: Diagnosis not present

## 2016-06-10 DIAGNOSIS — Z7689 Persons encountering health services in other specified circumstances: Secondary | ICD-10-CM | POA: Diagnosis not present

## 2016-06-10 DIAGNOSIS — R5383 Other fatigue: Secondary | ICD-10-CM | POA: Diagnosis not present

## 2016-06-10 DIAGNOSIS — Z5181 Encounter for therapeutic drug level monitoring: Secondary | ICD-10-CM | POA: Diagnosis not present

## 2016-06-10 DIAGNOSIS — E0500 Thyrotoxicosis with diffuse goiter without thyrotoxic crisis or storm: Secondary | ICD-10-CM | POA: Diagnosis not present

## 2016-07-01 DIAGNOSIS — E7800 Pure hypercholesterolemia, unspecified: Secondary | ICD-10-CM | POA: Diagnosis not present

## 2016-07-01 DIAGNOSIS — I10 Essential (primary) hypertension: Secondary | ICD-10-CM | POA: Diagnosis not present

## 2016-07-01 DIAGNOSIS — E119 Type 2 diabetes mellitus without complications: Secondary | ICD-10-CM | POA: Diagnosis not present

## 2016-07-01 DIAGNOSIS — E559 Vitamin D deficiency, unspecified: Secondary | ICD-10-CM | POA: Diagnosis not present

## 2016-08-05 DIAGNOSIS — M329 Systemic lupus erythematosus, unspecified: Secondary | ICD-10-CM | POA: Diagnosis not present

## 2016-08-05 DIAGNOSIS — I00 Rheumatic fever without heart involvement: Secondary | ICD-10-CM | POA: Diagnosis not present

## 2016-08-05 DIAGNOSIS — Z0000 Encounter for general adult medical examination without abnormal findings: Secondary | ICD-10-CM | POA: Diagnosis not present

## 2016-08-05 DIAGNOSIS — E119 Type 2 diabetes mellitus without complications: Secondary | ICD-10-CM | POA: Diagnosis not present

## 2016-08-05 DIAGNOSIS — E559 Vitamin D deficiency, unspecified: Secondary | ICD-10-CM | POA: Diagnosis not present

## 2016-08-05 DIAGNOSIS — Z23 Encounter for immunization: Secondary | ICD-10-CM | POA: Diagnosis not present

## 2016-08-05 DIAGNOSIS — R5383 Other fatigue: Secondary | ICD-10-CM | POA: Diagnosis not present

## 2016-08-05 DIAGNOSIS — E7800 Pure hypercholesterolemia, unspecified: Secondary | ICD-10-CM | POA: Diagnosis not present

## 2016-08-06 DIAGNOSIS — M064 Inflammatory polyarthropathy: Secondary | ICD-10-CM | POA: Diagnosis not present

## 2016-08-06 DIAGNOSIS — M329 Systemic lupus erythematosus, unspecified: Secondary | ICD-10-CM | POA: Diagnosis not present

## 2016-08-06 DIAGNOSIS — R21 Rash and other nonspecific skin eruption: Secondary | ICD-10-CM | POA: Diagnosis not present

## 2016-08-06 DIAGNOSIS — Z79899 Other long term (current) drug therapy: Secondary | ICD-10-CM | POA: Diagnosis not present

## 2016-09-10 DIAGNOSIS — M329 Systemic lupus erythematosus, unspecified: Secondary | ICD-10-CM | POA: Diagnosis not present

## 2016-12-08 DIAGNOSIS — E119 Type 2 diabetes mellitus without complications: Secondary | ICD-10-CM | POA: Diagnosis not present

## 2016-12-08 DIAGNOSIS — I00 Rheumatic fever without heart involvement: Secondary | ICD-10-CM | POA: Diagnosis not present

## 2016-12-08 DIAGNOSIS — E559 Vitamin D deficiency, unspecified: Secondary | ICD-10-CM | POA: Diagnosis not present

## 2016-12-08 DIAGNOSIS — Z5181 Encounter for therapeutic drug level monitoring: Secondary | ICD-10-CM | POA: Diagnosis not present

## 2016-12-08 DIAGNOSIS — I10 Essential (primary) hypertension: Secondary | ICD-10-CM | POA: Diagnosis not present

## 2016-12-09 DIAGNOSIS — E119 Type 2 diabetes mellitus without complications: Secondary | ICD-10-CM | POA: Diagnosis not present

## 2016-12-26 DIAGNOSIS — M79673 Pain in unspecified foot: Secondary | ICD-10-CM | POA: Diagnosis not present

## 2016-12-27 DIAGNOSIS — M329 Systemic lupus erythematosus, unspecified: Secondary | ICD-10-CM | POA: Diagnosis not present

## 2016-12-27 DIAGNOSIS — M064 Inflammatory polyarthropathy: Secondary | ICD-10-CM | POA: Diagnosis not present

## 2016-12-27 DIAGNOSIS — Z79899 Other long term (current) drug therapy: Secondary | ICD-10-CM | POA: Diagnosis not present

## 2016-12-27 DIAGNOSIS — R21 Rash and other nonspecific skin eruption: Secondary | ICD-10-CM | POA: Diagnosis not present

## 2017-02-05 DIAGNOSIS — M7062 Trochanteric bursitis, left hip: Secondary | ICD-10-CM | POA: Diagnosis not present

## 2017-03-25 DIAGNOSIS — M545 Low back pain: Secondary | ICD-10-CM | POA: Diagnosis not present

## 2017-03-30 DIAGNOSIS — M722 Plantar fascial fibromatosis: Secondary | ICD-10-CM | POA: Diagnosis not present

## 2017-03-30 DIAGNOSIS — M2141 Flat foot [pes planus] (acquired), right foot: Secondary | ICD-10-CM | POA: Diagnosis not present

## 2017-03-30 DIAGNOSIS — M2142 Flat foot [pes planus] (acquired), left foot: Secondary | ICD-10-CM | POA: Diagnosis not present

## 2017-05-10 DIAGNOSIS — R52 Pain, unspecified: Secondary | ICD-10-CM | POA: Diagnosis not present

## 2017-05-10 DIAGNOSIS — M79672 Pain in left foot: Secondary | ICD-10-CM | POA: Diagnosis not present

## 2017-05-11 ENCOUNTER — Emergency Department (HOSPITAL_COMMUNITY)
Admission: EM | Admit: 2017-05-11 | Discharge: 2017-05-11 | Disposition: A | Discharge status: Home / Self Care | Payer: Medicare Other | Attending: Emergency Medicine | Admitting: Emergency Medicine

## 2017-05-11 DIAGNOSIS — H6092 Unspecified otitis externa, left ear: Secondary | ICD-10-CM | POA: Diagnosis not present

## 2017-05-11 DIAGNOSIS — E119 Type 2 diabetes mellitus without complications: Secondary | ICD-10-CM | POA: Diagnosis not present

## 2017-05-11 DIAGNOSIS — F17210 Nicotine dependence, cigarettes, uncomplicated: Secondary | ICD-10-CM | POA: Diagnosis not present

## 2017-05-11 DIAGNOSIS — Z79899 Other long term (current) drug therapy: Secondary | ICD-10-CM | POA: Diagnosis not present

## 2017-05-11 DIAGNOSIS — I10 Essential (primary) hypertension: Secondary | ICD-10-CM | POA: Diagnosis not present

## 2017-05-11 DIAGNOSIS — H6091 Unspecified otitis externa, right ear: Secondary | ICD-10-CM | POA: Diagnosis not present

## 2017-05-11 DIAGNOSIS — M79673 Pain in unspecified foot: Secondary | ICD-10-CM | POA: Diagnosis present

## 2017-05-11 MED ORDER — METHYLPREDNISOLONE SODIUM SUCC 125 MG IJ SOLR
125.0000 mg | Freq: Once | INTRAMUSCULAR | Status: AC
  Administered 2017-05-11: 125 mg via INTRAMUSCULAR
  Filled 2017-05-11: qty 2

## 2017-05-11 MED ORDER — NEOMYCIN-POLYMYXIN-HC 3.5-10000-1 OT SUSP: 4.0000 [drp] | Freq: Three times a day (TID) | OTIC | 0 refills | Status: AC

## 2017-05-11 MED ORDER — OXYCODONE-ACETAMINOPHEN 5-325 MG PO TABS
2.0000 | ORAL_TABLET | Freq: Once | ORAL | Status: AC
  Administered 2017-05-11: 2 via ORAL
  Filled 2017-05-11: qty 2
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
## 2017-05-11 NOTE — ED Notes (Signed)
My only role/function in this pt. Chart is to d/c. This pt. This pt. Name/d.o.b. Was inadvertently chosen by our registrationist--the actual pt. Name was [name], [place] Feb 17, 1961. Our I.T. Department were made aware of this and an imminent chart merge will occur. Again, [name] with D.O.B. Of 06-29-1961 and has a [place] address never arrived here.

## 2017-05-11 NOTE — Discharge Instructions (Signed)
Call Dr. Selena Batten today to discuss pain medication

## 2017-05-12 ENCOUNTER — Emergency Department (HOSPITAL_COMMUNITY)
Admission: EM | Admit: 2017-05-12 | Discharge: 2017-05-13 | Disposition: A | Discharge status: Home / Self Care | Payer: Medicare Other | Attending: Emergency Medicine | Admitting: Emergency Medicine

## 2017-05-12 ENCOUNTER — Encounter (HOSPITAL_COMMUNITY): Payer: Self-pay | Admitting: Emergency Medicine

## 2017-05-12 ENCOUNTER — Ambulatory Visit (HOSPITAL_COMMUNITY)
Admission: RE | Admit: 2017-05-12 | Discharge: 2017-05-12 | Disposition: A | Discharge status: Home / Self Care | Payer: Medicare Other | Source: Home / Self Care | Attending: Psychiatry | Admitting: Psychiatry

## 2017-05-12 DIAGNOSIS — F4325 Adjustment disorder with mixed disturbance of emotions and conduct: Secondary | ICD-10-CM | POA: Diagnosis not present

## 2017-05-12 DIAGNOSIS — E119 Type 2 diabetes mellitus without complications: Secondary | ICD-10-CM | POA: Insufficient documentation

## 2017-05-12 DIAGNOSIS — F332 Major depressive disorder, recurrent severe without psychotic features: Secondary | ICD-10-CM

## 2017-05-12 DIAGNOSIS — M069 Rheumatoid arthritis, unspecified: Secondary | ICD-10-CM

## 2017-05-12 DIAGNOSIS — Z90710 Acquired absence of both cervix and uterus: Secondary | ICD-10-CM | POA: Insufficient documentation

## 2017-05-12 DIAGNOSIS — Z79899 Other long term (current) drug therapy: Secondary | ICD-10-CM | POA: Insufficient documentation

## 2017-05-12 DIAGNOSIS — Z794 Long term (current) use of insulin: Secondary | ICD-10-CM

## 2017-05-12 DIAGNOSIS — R45851 Suicidal ideations: Secondary | ICD-10-CM | POA: Diagnosis not present

## 2017-05-12 DIAGNOSIS — Z833 Family history of diabetes mellitus: Secondary | ICD-10-CM | POA: Insufficient documentation

## 2017-05-12 DIAGNOSIS — G894 Chronic pain syndrome: Secondary | ICD-10-CM

## 2017-05-12 DIAGNOSIS — Z882 Allergy status to sulfonamides status: Secondary | ICD-10-CM | POA: Insufficient documentation

## 2017-05-12 DIAGNOSIS — I10 Essential (primary) hypertension: Secondary | ICD-10-CM | POA: Insufficient documentation

## 2017-05-12 DIAGNOSIS — E785 Hyperlipidemia, unspecified: Secondary | ICD-10-CM | POA: Diagnosis not present

## 2017-05-12 DIAGNOSIS — E7800 Pure hypercholesterolemia, unspecified: Secondary | ICD-10-CM | POA: Diagnosis not present

## 2017-05-12 DIAGNOSIS — Z886 Allergy status to analgesic agent status: Secondary | ICD-10-CM | POA: Insufficient documentation

## 2017-05-12 DIAGNOSIS — Z8249 Family history of ischemic heart disease and other diseases of the circulatory system: Secondary | ICD-10-CM

## 2017-05-12 DIAGNOSIS — Z592 Discord with neighbors, lodgers and landlord: Secondary | ICD-10-CM | POA: Diagnosis not present

## 2017-05-12 DIAGNOSIS — F4310 Post-traumatic stress disorder, unspecified: Secondary | ICD-10-CM | POA: Insufficient documentation

## 2017-05-12 DIAGNOSIS — Z7952 Long term (current) use of systemic steroids: Secondary | ICD-10-CM

## 2017-05-12 DIAGNOSIS — M1990 Unspecified osteoarthritis, unspecified site: Secondary | ICD-10-CM

## 2017-05-12 DIAGNOSIS — F329 Major depressive disorder, single episode, unspecified: Secondary | ICD-10-CM | POA: Diagnosis present

## 2017-05-12 DIAGNOSIS — F17210 Nicotine dependence, cigarettes, uncomplicated: Secondary | ICD-10-CM | POA: Insufficient documentation

## 2017-05-12 DIAGNOSIS — M329 Systemic lupus erythematosus, unspecified: Secondary | ICD-10-CM

## 2017-05-12 DIAGNOSIS — F1210 Cannabis abuse, uncomplicated: Secondary | ICD-10-CM

## 2017-05-12 LAB — COMPREHENSIVE METABOLIC PANEL
ALBUMIN: 4.2 g/dL (ref 3.5–5.0)
ALT: 22 U/L (ref 14–54)
AST: 22 U/L (ref 15–41)
Alkaline Phosphatase: 80 U/L (ref 38–126)
Anion gap: 8 (ref 5–15)
BILIRUBIN TOTAL: 0.3 mg/dL (ref 0.3–1.2)
BUN: 15 mg/dL (ref 6–20)
CHLORIDE: 108 mmol/L (ref 101–111)
CO2: 26 mmol/L (ref 22–32)
Calcium: 9.6 mg/dL (ref 8.9–10.3)
Creatinine, Ser: 0.81 mg/dL (ref 0.44–1.00)
GFR calc Af Amer: >60 mL/min (ref >60)
GFR calc non Af Amer: >60 mL/min (ref >60)
GLUCOSE: 127 mg/dL — AB (ref 65–99)
Potassium: 3.1 mmol/L — ABNORMAL LOW (ref 3.5–5.1)
Sodium: 142 mmol/L (ref 135–145)
Total Protein: 7.9 g/dL (ref 6.5–8.1)

## 2017-05-12 LAB — CBC
HEMATOCRIT: 38 % (ref 36.0–46.0)
Hemoglobin: 11.5 g/dL — ABNORMAL LOW (ref 12.0–15.0)
MCH: 30.3 pg (ref 26.0–34.0)
MCHC: 30.3 g/dL (ref 30.0–36.0)
MCV: 100.3 fL — AB (ref 78.0–100.0)
Platelets: 302 10*3/uL (ref 150–400)
RBC: 3.79 MIL/uL — AB (ref 3.87–5.11)
RDW: 14.4 % (ref 11.5–15.5)
WBC: 12.4 10*3/uL — AB (ref 4.0–10.5)

## 2017-05-12 LAB — ETHANOL: Alcohol, Ethyl (B): <10 mg/dL (ref <10)

## 2017-05-12 LAB — I-STAT BETA HCG BLOOD, ED (MC, WL, AP ONLY): I-stat hCG, quantitative: <5 m[IU]/mL (ref <5)

## 2017-05-12 LAB — SALICYLATE LEVEL: Salicylate Lvl: <7 mg/dL (ref 2.8–30.0)

## 2017-05-12 LAB — ACETAMINOPHEN LEVEL: Acetaminophen (Tylenol), Serum: <10 ug/mL — ABNORMAL LOW (ref 10–30)

## 2017-05-12 MED ORDER — PREDNISONE 20 MG PO TABS
10.0000 mg | ORAL_TABLET | Freq: Every day | ORAL | Status: DC
  Administered 2017-05-13: 10 mg via ORAL
  Filled 2017-05-12: qty 1

## 2017-05-12 MED ORDER — DULOXETINE HCL 30 MG PO CPEP
60.0000 mg | ORAL_CAPSULE | Freq: Every day | ORAL | Status: DC
  Administered 2017-05-13: 60 mg via ORAL
  Filled 2017-05-12: qty 2

## 2017-05-12 MED ORDER — OXYCODONE-ACETAMINOPHEN 5-325 MG PO TABS
1.0000 | ORAL_TABLET | Freq: Four times a day (QID) | ORAL | Status: DC | PRN
Start: 2017-05-12 — End: 2017-05-13
  Administered 2017-05-12 – 2017-05-13 (×2): 2 via ORAL
  Filled 2017-05-12 (×2): qty 2

## 2017-05-12 MED ORDER — PANTOPRAZOLE SODIUM 40 MG PO TBEC
40.0000 mg | DELAYED_RELEASE_TABLET | Freq: Every day | ORAL | Status: DC
  Administered 2017-05-13: 40 mg via ORAL
  Filled 2017-05-12: qty 1

## 2017-05-12 MED ORDER — NEOMYCIN-POLYMYXIN-HC 3.5-10000-1 OT SUSP
4.0000 [drp] | Freq: Three times a day (TID) | OTIC | Status: DC
  Administered 2017-05-13 (×2): 4 [drp] via OTIC
  Filled 2017-05-12: qty 10

## 2017-05-12 MED ORDER — HYDROXYCHLOROQUINE SULFATE 200 MG PO TABS
200.0000 mg | ORAL_TABLET | Freq: Two times a day (BID) | ORAL | Status: DC
  Administered 2017-05-13: 200 mg via ORAL
  Filled 2017-05-12: qty 1

## 2017-05-12 MED ORDER — INSULIN GLARGINE 100 UNIT/ML ~~LOC~~ SOLN
10.0000 [IU] | Freq: Every day | SUBCUTANEOUS | Status: DC
  Administered 2017-05-13: 10 [IU] via SUBCUTANEOUS
  Filled 2017-05-12 (×2): qty 0.1

## 2017-05-12 MED ORDER — AZATHIOPRINE 50 MG PO TABS
150.0000 mg | ORAL_TABLET | Freq: Every day | ORAL | Status: DC
  Administered 2017-05-13: 150 mg via ORAL
  Filled 2017-05-12: qty 3

## 2017-05-12 MED ORDER — LISINOPRIL-HYDROCHLOROTHIAZIDE 20-12.5 MG PO TABS: 2.0000 | ORAL_TABLET | Freq: Every day | ORAL | Status: DC

## 2017-05-12 MED ORDER — MIRTAZAPINE 30 MG PO TABS
15.0000 mg | ORAL_TABLET | Freq: Every day | ORAL | Status: DC
  Administered 2017-05-12: 15 mg via ORAL
  Filled 2017-05-12: qty 1

## 2017-05-12 MED ORDER — ATORVASTATIN CALCIUM 20 MG PO TABS
20.0000 mg | ORAL_TABLET | Freq: Every day | ORAL | Status: DC
  Administered 2017-05-13: 20 mg via ORAL
  Filled 2017-05-12: qty 1

## 2017-05-12 NOTE — H&P (Signed)
Behavioral Health Medical Screening Exam  [name] is an 57 y.o. female.  Total Time spent with patient: 20 minutes  Psychiatric Specialty Exam: Physical Exam  Constitutional: She is oriented to person, place, and time. She appears well-developed and well-nourished. No distress.  HENT:  Head: Normocephalic and atraumatic.  Right Ear: External ear normal.  Left Ear: External ear normal.  Eyes: Conjunctivae are normal. Right eye exhibits no discharge. Left eye exhibits no discharge.  Cardiovascular: Normal rate, regular rhythm and normal heart sounds.  Respiratory: Effort normal and breath sounds normal. No respiratory distress.  Musculoskeletal: Normal range of motion.  Neurological: She is alert and oriented to person, place, and time.  Skin: Skin is warm and dry. She is not diaphoretic.  Psychiatric: Her speech is normal. Her mood appears anxious. She is not actively hallucinating. Thought content is not paranoid and not delusional. She exhibits a depressed mood. She expresses suicidal ideation. She expresses no homicidal ideation.    Review of Systems  Musculoskeletal: Positive for back pain and joint pain.       Reports back, leg, foot pain of 10/10. States that this typical due to lupus. Received steroid injection a couple of days ago in the ED.  Psychiatric/Behavioral: Positive for depression and suicidal ideas. Negative for hallucinations, memory loss and substance abuse. The patient is nervous/anxious and has insomnia.     Blood pressure (!) 160/90, pulse 87, temperature 98.2 F (36.8 C), resp. rate 18, last menstrual period 08/21/2014, SpO2 100 %.There is no height or weight on file to calculate BMI.  General Appearance: Casual and Fairly Groomed  Eye Contact:  Good  Speech:  Clear and Coherent and Normal Rate  Volume:  Normal  Mood:  Anxious, Depressed, Dysphoric, Hopeless and Worthless  Affect:  Congruent, Depressed and Tearful  Thought Process:  Coherent, Goal  Directed and Descriptions of Associations: Intact  Orientation:  Full (Time, Place, and Person)  Thought Content:  Logical and Hallucinations: None  Suicidal Thoughts:  Yes.  without intent/plan  Homicidal Thoughts:  No  Memory:  Immediate;   Fair Recent;   Fair  Judgement:  Intact  Insight:  Fair  Psychomotor Activity:  Normal  Concentration: Concentration: Fair and Attention Span: Fair  Recall:  [employer] of Knowledge:Fair  Language: Good  Akathisia:  No  Handed:  Right  AIMS (if indicated):     Assets:  Communication Skills Desire for Improvement Financial Resources/Insurance Housing Leisure Time  Sleep:       Musculoskeletal: Strength & Muscle Tone: within normal limits Gait & Station: normal   Last menstrual period 08/21/2014.  Recommendations:  Based on my evaluation the patient does not appear to have an emergency medical condition.  Jackelyn Poling, NP 05/12/2017, 8:59 PM
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
## 2017-05-12 NOTE — ED Notes (Signed)
Bed: ZHG99 Expected date:  Expected time:  Means of arrival:  Comments: TR 5

## 2017-05-12 NOTE — ED Notes (Signed)
Patient changed into paper scrubs. 

## 2017-05-12 NOTE — ED Notes (Signed)
Bed: WTR6 Expected date:  Expected time:  Means of arrival:  Comments: From [facility] SI/depression
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
## 2017-05-12 NOTE — ED Notes (Signed)
Patient also reports bilateral foot pain r/t lupus.
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
## 2017-05-13 DIAGNOSIS — Z592 Discord with neighbors, lodgers and landlord: Secondary | ICD-10-CM

## 2017-05-13 DIAGNOSIS — F4325 Adjustment disorder with mixed disturbance of emotions and conduct: Secondary | ICD-10-CM | POA: Diagnosis present

## 2017-05-13 DIAGNOSIS — F17210 Nicotine dependence, cigarettes, uncomplicated: Secondary | ICD-10-CM

## 2017-05-13 DIAGNOSIS — Z79899 Other long term (current) drug therapy: Secondary | ICD-10-CM

## 2017-05-13 DIAGNOSIS — R45851 Suicidal ideations: Secondary | ICD-10-CM

## 2017-05-13 DIAGNOSIS — F329 Major depressive disorder, single episode, unspecified: Secondary | ICD-10-CM | POA: Diagnosis not present

## 2017-05-13 LAB — RAPID URINE DRUG SCREEN, HOSP PERFORMED
Amphetamines: NOT DETECTED
BENZODIAZEPINES: NOT DETECTED
Barbiturates: NOT DETECTED
COCAINE: NOT DETECTED
OPIATES: NOT DETECTED
Tetrahydrocannabinol: POSITIVE — AB

## 2017-05-13 LAB — CBG MONITORING, ED
GLUCOSE-CAPILLARY: 88 mg/dL (ref 65–99)
Glucose-Capillary: 108 mg/dL — ABNORMAL HIGH (ref 65–99)

## 2017-05-13 MED ORDER — NICOTINE 21 MG/24HR TD PT24
21.0000 mg | MEDICATED_PATCH | Freq: Once | TRANSDERMAL | Status: DC
  Administered 2017-05-13: 21 mg via TRANSDERMAL
  Filled 2017-05-13: qty 1

## 2017-05-13 MED ORDER — LISINOPRIL 20 MG PO TABS
20.0000 mg | ORAL_TABLET | Freq: Every day | ORAL | Status: DC
  Administered 2017-05-13: 20 mg via ORAL
  Filled 2017-05-13: qty 1

## 2017-05-13 MED ORDER — HYDROCHLOROTHIAZIDE 12.5 MG PO CAPS
12.5000 mg | ORAL_CAPSULE | Freq: Every day | ORAL | Status: DC
  Administered 2017-05-13: 12.5 mg via ORAL
  Filled 2017-05-13: qty 1
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
## 2017-05-13 NOTE — ED Notes (Signed)
Patient reports SI no with no plan. Patient denies HI/AVH at this time. Plan of care discussed. Encouragement and support provided and safety maintain. Q 15 min safety check in place and video monitoring.
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
## 2017-05-13 NOTE — ED Notes (Signed)
Snack given.
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
## 2017-05-13 NOTE — ED Notes (Signed)
Pt d/c home per MD order. Discharge summary reviewed with pt, pt uninterested. Pt not endorsing SI/HI/AVH. Shelter resources from Child psychotherapist given to pt. Pt signed for personal property and property returned, pt signed e-signature. Ambulatory off unit with [facility].

## 2017-05-19 DIAGNOSIS — G8929 Other chronic pain: Secondary | ICD-10-CM | POA: Diagnosis not present

## 2017-05-19 DIAGNOSIS — M5442 Lumbago with sciatica, left side: Secondary | ICD-10-CM | POA: Diagnosis not present

## 2017-05-22 DIAGNOSIS — I10 Essential (primary) hypertension: Secondary | ICD-10-CM | POA: Diagnosis not present

## 2017-05-22 DIAGNOSIS — G8929 Other chronic pain: Secondary | ICD-10-CM | POA: Diagnosis not present

## 2017-05-22 DIAGNOSIS — M545 Low back pain: Secondary | ICD-10-CM | POA: Diagnosis not present

## 2017-05-22 DIAGNOSIS — Z8739 Personal history of other diseases of the musculoskeletal system and connective tissue: Secondary | ICD-10-CM | POA: Diagnosis not present

## 2017-05-22 DIAGNOSIS — E785 Hyperlipidemia, unspecified: Secondary | ICD-10-CM | POA: Diagnosis not present

## 2017-05-22 DIAGNOSIS — E119 Type 2 diabetes mellitus without complications: Secondary | ICD-10-CM | POA: Diagnosis not present

## 2017-06-11 DIAGNOSIS — Z760 Encounter for issue of repeat prescription: Secondary | ICD-10-CM | POA: Diagnosis not present

## 2017-06-11 DIAGNOSIS — M329 Systemic lupus erythematosus, unspecified: Secondary | ICD-10-CM | POA: Diagnosis not present

## 2017-06-20 DIAGNOSIS — E119 Type 2 diabetes mellitus without complications: Secondary | ICD-10-CM | POA: Diagnosis not present

## 2017-06-22 DIAGNOSIS — M545 Low back pain: Secondary | ICD-10-CM | POA: Diagnosis not present

## 2017-06-22 DIAGNOSIS — I10 Essential (primary) hypertension: Secondary | ICD-10-CM | POA: Diagnosis not present

## 2017-06-22 DIAGNOSIS — E7800 Pure hypercholesterolemia, unspecified: Secondary | ICD-10-CM | POA: Diagnosis not present

## 2017-06-22 DIAGNOSIS — E119 Type 2 diabetes mellitus without complications: Secondary | ICD-10-CM | POA: Diagnosis not present

## 2017-06-22 DIAGNOSIS — Z5181 Encounter for therapeutic drug level monitoring: Secondary | ICD-10-CM | POA: Diagnosis not present

## 2017-09-28 DIAGNOSIS — I10 Essential (primary) hypertension: Secondary | ICD-10-CM | POA: Diagnosis not present

## 2017-09-28 DIAGNOSIS — I00 Rheumatic fever without heart involvement: Secondary | ICD-10-CM | POA: Diagnosis not present

## 2017-09-28 DIAGNOSIS — E119 Type 2 diabetes mellitus without complications: Secondary | ICD-10-CM | POA: Diagnosis not present

## 2017-09-28 DIAGNOSIS — Z5181 Encounter for therapeutic drug level monitoring: Secondary | ICD-10-CM | POA: Diagnosis not present

## 2017-09-28 DIAGNOSIS — M545 Low back pain: Secondary | ICD-10-CM | POA: Diagnosis not present

## 2017-10-02 IMAGING — DX DG TOE 5TH 2+V*R*
4 series · 4 of 4 positions shown · non-contrast
Comparison: October 13, 2008.

CLINICAL DATA: Right fifth toe pain after fall today. Initial
encounter.

EXAM:
RIGHT FIFTH TOE

[x toes ap right]
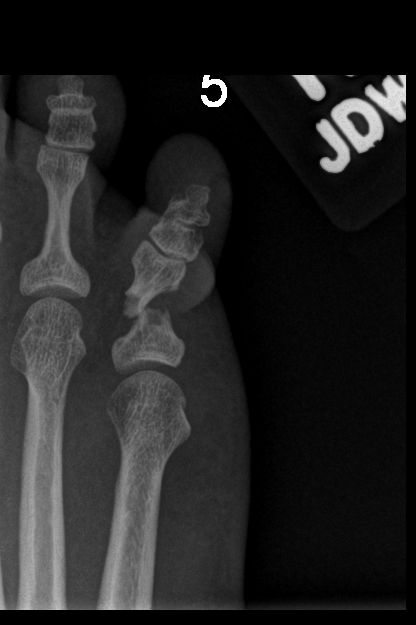

[x toes obl right]
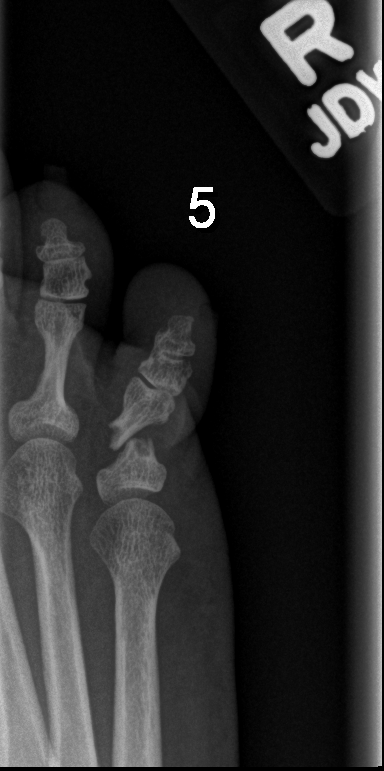

[x toes lat right (1 of 2)]
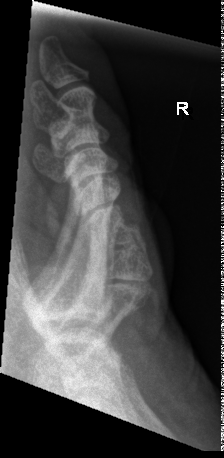

[x toes lat right (2 of 2)]
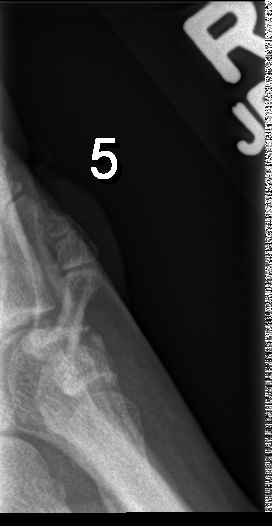

[4 of 4 positions shown; findings below may reference images not displayed]

FINDINGS: Severely displaced and angulated fracture is seen involving the
fifth proximal phalanx. This appears to be closed and posttraumatic.
Joint spaces are intact. No soft tissue abnormality is noted.
IMPRESSION: Severely displaced and angulated fifth proximal phalangeal fracture.

## 2017-11-23 DIAGNOSIS — R569 Unspecified convulsions: Secondary | ICD-10-CM | POA: Diagnosis not present

## 2017-11-23 DIAGNOSIS — R251 Tremor, unspecified: Secondary | ICD-10-CM | POA: Diagnosis not present

## 2017-11-23 DIAGNOSIS — R531 Weakness: Secondary | ICD-10-CM | POA: Diagnosis not present

## 2017-11-23 DIAGNOSIS — R55 Syncope and collapse: Secondary | ICD-10-CM | POA: Diagnosis not present

## 2017-11-23 DIAGNOSIS — N3000 Acute cystitis without hematuria: Secondary | ICD-10-CM | POA: Diagnosis not present

## 2017-11-23 DIAGNOSIS — I517 Cardiomegaly: Secondary | ICD-10-CM | POA: Diagnosis not present

## 2017-11-24 DIAGNOSIS — I517 Cardiomegaly: Secondary | ICD-10-CM | POA: Diagnosis not present

## 2017-12-08 ENCOUNTER — Other Ambulatory Visit: Payer: Self-pay

## 2017-12-08 NOTE — Patient Outreach (Signed)
[facility]) Care Management  12/08/2017  [name] January 06, 1961 [id number]   medication Adherence call to Mrs : [name] patient did not answer patient is due on Lisinopril /Hctz 20/12 :5 mg . [name] is showing past due under [place] [employer].   Lillia Abed CPhT Pharmacy Technician [facility] Management Direct Dial [phone]  Fax [phone] [email or web address]

## 2018-01-10 DIAGNOSIS — E119 Type 2 diabetes mellitus without complications: Secondary | ICD-10-CM | POA: Diagnosis not present

## 2018-01-10 DIAGNOSIS — I10 Essential (primary) hypertension: Secondary | ICD-10-CM | POA: Diagnosis not present

## 2018-01-10 DIAGNOSIS — Z79899 Other long term (current) drug therapy: Secondary | ICD-10-CM | POA: Diagnosis not present

## 2018-01-10 DIAGNOSIS — I00 Rheumatic fever without heart involvement: Secondary | ICD-10-CM | POA: Diagnosis not present

## 2018-01-18 DIAGNOSIS — R42 Dizziness and giddiness: Secondary | ICD-10-CM | POA: Diagnosis not present

## 2018-01-18 DIAGNOSIS — R55 Syncope and collapse: Secondary | ICD-10-CM | POA: Diagnosis not present

## 2018-01-18 DIAGNOSIS — E876 Hypokalemia: Secondary | ICD-10-CM | POA: Diagnosis not present

## 2018-01-18 DIAGNOSIS — R112 Nausea with vomiting, unspecified: Secondary | ICD-10-CM | POA: Diagnosis not present

## 2018-02-13 DIAGNOSIS — R05 Cough: Secondary | ICD-10-CM | POA: Diagnosis not present

## 2018-02-13 DIAGNOSIS — R1110 Vomiting, unspecified: Secondary | ICD-10-CM | POA: Diagnosis not present

## 2018-02-13 DIAGNOSIS — J40 Bronchitis, not specified as acute or chronic: Secondary | ICD-10-CM | POA: Diagnosis not present

## 2018-02-13 DIAGNOSIS — R531 Weakness: Secondary | ICD-10-CM | POA: Diagnosis not present

## 2018-02-13 DIAGNOSIS — R079 Chest pain, unspecified: Secondary | ICD-10-CM | POA: Diagnosis not present

## 2018-02-13 DIAGNOSIS — R5381 Other malaise: Secondary | ICD-10-CM | POA: Diagnosis not present

## 2018-02-17 DIAGNOSIS — R0689 Other abnormalities of breathing: Secondary | ICD-10-CM | POA: Diagnosis not present

## 2018-02-17 DIAGNOSIS — R0602 Shortness of breath: Secondary | ICD-10-CM | POA: Diagnosis not present

## 2018-02-17 DIAGNOSIS — I10 Essential (primary) hypertension: Secondary | ICD-10-CM | POA: Diagnosis not present

## 2018-02-17 DIAGNOSIS — R0600 Dyspnea, unspecified: Secondary | ICD-10-CM | POA: Diagnosis not present

## 2018-02-17 DIAGNOSIS — J80 Acute respiratory distress syndrome: Secondary | ICD-10-CM | POA: Diagnosis not present

## 2018-02-17 DIAGNOSIS — R9431 Abnormal electrocardiogram [ECG] [EKG]: Secondary | ICD-10-CM | POA: Diagnosis not present

## 2018-02-17 DIAGNOSIS — J40 Bronchitis, not specified as acute or chronic: Secondary | ICD-10-CM | POA: Diagnosis not present

## 2018-03-03 ENCOUNTER — Ambulatory Visit: Payer: Medicare Other | Admitting: Diagnostic Neuroimaging

## 2018-03-03 ENCOUNTER — Encounter: Payer: Self-pay | Admitting: *Deleted

## 2018-03-03 ENCOUNTER — Telehealth: Payer: Self-pay | Admitting: *Deleted

## 2018-03-03 NOTE — Telephone Encounter (Signed)
Patient was no show for new patient appointment today. 

## 2018-03-06 ENCOUNTER — Encounter: Payer: Self-pay | Admitting: Diagnostic Neuroimaging

## 2018-03-22 DIAGNOSIS — Z23 Encounter for immunization: Secondary | ICD-10-CM | POA: Diagnosis not present

## 2018-03-22 DIAGNOSIS — M5030 Other cervical disc degeneration, unspecified cervical region: Secondary | ICD-10-CM | POA: Diagnosis not present

## 2018-03-22 DIAGNOSIS — M545 Low back pain: Secondary | ICD-10-CM | POA: Diagnosis not present

## 2018-03-22 DIAGNOSIS — Z79899 Other long term (current) drug therapy: Secondary | ICD-10-CM | POA: Diagnosis not present

## 2018-03-22 DIAGNOSIS — M546 Pain in thoracic spine: Secondary | ICD-10-CM | POA: Diagnosis not present

## 2018-04-19 DIAGNOSIS — M545 Low back pain: Secondary | ICD-10-CM | POA: Diagnosis not present

## 2018-04-19 DIAGNOSIS — Z79899 Other long term (current) drug therapy: Secondary | ICD-10-CM | POA: Diagnosis not present

## 2018-04-19 DIAGNOSIS — I10 Essential (primary) hypertension: Secondary | ICD-10-CM | POA: Diagnosis not present

## 2018-04-19 DIAGNOSIS — M329 Systemic lupus erythematosus, unspecified: Secondary | ICD-10-CM | POA: Diagnosis not present

## 2018-04-19 DIAGNOSIS — E1165 Type 2 diabetes mellitus with hyperglycemia: Secondary | ICD-10-CM | POA: Diagnosis not present

## 2018-05-06 DIAGNOSIS — M545 Low back pain: Secondary | ICD-10-CM | POA: Diagnosis not present

## 2018-05-06 DIAGNOSIS — M25552 Pain in left hip: Secondary | ICD-10-CM | POA: Diagnosis not present

## 2018-05-06 DIAGNOSIS — M069 Rheumatoid arthritis, unspecified: Secondary | ICD-10-CM | POA: Diagnosis not present

## 2018-05-06 DIAGNOSIS — M546 Pain in thoracic spine: Secondary | ICD-10-CM | POA: Diagnosis not present

## 2018-05-06 DIAGNOSIS — M329 Systemic lupus erythematosus, unspecified: Secondary | ICD-10-CM | POA: Diagnosis not present

## 2018-05-06 DIAGNOSIS — M25551 Pain in right hip: Secondary | ICD-10-CM | POA: Diagnosis not present

## 2018-05-06 DIAGNOSIS — Z882 Allergy status to sulfonamides status: Secondary | ICD-10-CM | POA: Diagnosis not present

## 2018-05-06 DIAGNOSIS — Z886 Allergy status to analgesic agent status: Secondary | ICD-10-CM | POA: Diagnosis not present

## 2018-05-06 DIAGNOSIS — G8929 Other chronic pain: Secondary | ICD-10-CM | POA: Diagnosis not present

## 2018-05-06 DIAGNOSIS — M549 Dorsalgia, unspecified: Secondary | ICD-10-CM | POA: Diagnosis not present

## 2018-05-10 DIAGNOSIS — M25552 Pain in left hip: Secondary | ICD-10-CM | POA: Diagnosis not present

## 2018-05-10 DIAGNOSIS — M329 Systemic lupus erythematosus, unspecified: Secondary | ICD-10-CM | POA: Diagnosis not present

## 2018-05-10 DIAGNOSIS — M545 Low back pain: Secondary | ICD-10-CM | POA: Diagnosis not present

## 2018-05-10 DIAGNOSIS — I10 Essential (primary) hypertension: Secondary | ICD-10-CM | POA: Diagnosis not present

## 2018-05-10 DIAGNOSIS — E785 Hyperlipidemia, unspecified: Secondary | ICD-10-CM | POA: Diagnosis not present

## 2018-05-10 DIAGNOSIS — G8929 Other chronic pain: Secondary | ICD-10-CM | POA: Diagnosis not present

## 2018-05-10 DIAGNOSIS — M25551 Pain in right hip: Secondary | ICD-10-CM | POA: Diagnosis not present

## 2018-05-10 DIAGNOSIS — M069 Rheumatoid arthritis, unspecified: Secondary | ICD-10-CM | POA: Diagnosis not present

## 2018-05-15 DIAGNOSIS — G8929 Other chronic pain: Secondary | ICD-10-CM | POA: Diagnosis not present

## 2018-05-15 DIAGNOSIS — R52 Pain, unspecified: Secondary | ICD-10-CM | POA: Diagnosis not present

## 2018-05-15 DIAGNOSIS — Z79891 Long term (current) use of opiate analgesic: Secondary | ICD-10-CM | POA: Diagnosis not present

## 2018-05-18 DIAGNOSIS — G8929 Other chronic pain: Secondary | ICD-10-CM | POA: Diagnosis not present

## 2018-05-18 DIAGNOSIS — M069 Rheumatoid arthritis, unspecified: Secondary | ICD-10-CM | POA: Diagnosis not present

## 2018-05-18 DIAGNOSIS — M329 Systemic lupus erythematosus, unspecified: Secondary | ICD-10-CM | POA: Diagnosis not present

## 2018-05-18 DIAGNOSIS — R52 Pain, unspecified: Secondary | ICD-10-CM | POA: Diagnosis not present

## 2018-05-25 DIAGNOSIS — G43909 Migraine, unspecified, not intractable, without status migrainosus: Secondary | ICD-10-CM | POA: Diagnosis not present

## 2018-05-25 DIAGNOSIS — G8929 Other chronic pain: Secondary | ICD-10-CM | POA: Diagnosis not present

## 2018-05-25 DIAGNOSIS — M25559 Pain in unspecified hip: Secondary | ICD-10-CM | POA: Diagnosis not present

## 2018-05-25 DIAGNOSIS — M549 Dorsalgia, unspecified: Secondary | ICD-10-CM | POA: Diagnosis not present

## 2018-05-25 DIAGNOSIS — M79606 Pain in leg, unspecified: Secondary | ICD-10-CM | POA: Diagnosis not present

## 2018-05-25 DIAGNOSIS — M329 Systemic lupus erythematosus, unspecified: Secondary | ICD-10-CM | POA: Diagnosis not present

## 2018-05-26 DIAGNOSIS — G8929 Other chronic pain: Secondary | ICD-10-CM | POA: Diagnosis not present

## 2018-05-26 DIAGNOSIS — M329 Systemic lupus erythematosus, unspecified: Secondary | ICD-10-CM | POA: Diagnosis not present

## 2018-05-26 DIAGNOSIS — I10 Essential (primary) hypertension: Secondary | ICD-10-CM | POA: Diagnosis not present

## 2018-05-26 DIAGNOSIS — R52 Pain, unspecified: Secondary | ICD-10-CM | POA: Diagnosis not present

## 2018-05-26 DIAGNOSIS — E119 Type 2 diabetes mellitus without complications: Secondary | ICD-10-CM | POA: Diagnosis not present

## 2018-06-02 DIAGNOSIS — E119 Type 2 diabetes mellitus without complications: Secondary | ICD-10-CM | POA: Diagnosis not present

## 2018-06-02 DIAGNOSIS — M545 Low back pain: Secondary | ICD-10-CM | POA: Diagnosis not present

## 2018-06-02 DIAGNOSIS — G8929 Other chronic pain: Secondary | ICD-10-CM | POA: Diagnosis not present

## 2018-06-02 DIAGNOSIS — M549 Dorsalgia, unspecified: Secondary | ICD-10-CM | POA: Diagnosis not present

## 2018-06-02 DIAGNOSIS — I10 Essential (primary) hypertension: Secondary | ICD-10-CM | POA: Diagnosis not present

## 2021-01-26 DIAGNOSIS — M7581 Other shoulder lesions, right shoulder: Secondary | ICD-10-CM | POA: Diagnosis not present

## 2021-01-26 DIAGNOSIS — M329 Systemic lupus erythematosus, unspecified: Secondary | ICD-10-CM | POA: Diagnosis not present

## 2021-01-26 DIAGNOSIS — M545 Low back pain, unspecified: Secondary | ICD-10-CM | POA: Diagnosis not present

## 2021-01-26 DIAGNOSIS — M069 Rheumatoid arthritis, unspecified: Secondary | ICD-10-CM | POA: Diagnosis not present

## 2021-01-26 DIAGNOSIS — Z7689 Persons encountering health services in other specified circumstances: Secondary | ICD-10-CM | POA: Diagnosis not present

## 2021-01-26 DIAGNOSIS — Z23 Encounter for immunization: Secondary | ICD-10-CM | POA: Diagnosis not present

## 2021-02-02 DIAGNOSIS — Z23 Encounter for immunization: Secondary | ICD-10-CM | POA: Diagnosis not present

## 2021-02-02 DIAGNOSIS — M069 Rheumatoid arthritis, unspecified: Secondary | ICD-10-CM | POA: Diagnosis not present

## 2021-02-02 DIAGNOSIS — M329 Systemic lupus erythematosus, unspecified: Secondary | ICD-10-CM | POA: Diagnosis not present

## 2021-02-17 DIAGNOSIS — Z13220 Encounter for screening for lipoid disorders: Secondary | ICD-10-CM | POA: Diagnosis not present

## 2021-02-17 DIAGNOSIS — E1149 Type 2 diabetes mellitus with other diabetic neurological complication: Secondary | ICD-10-CM | POA: Diagnosis not present

## 2021-02-23 DIAGNOSIS — E1139 Type 2 diabetes mellitus with other diabetic ophthalmic complication: Secondary | ICD-10-CM | POA: Diagnosis not present

## 2021-02-23 DIAGNOSIS — M329 Systemic lupus erythematosus, unspecified: Secondary | ICD-10-CM | POA: Diagnosis not present

## 2021-02-23 DIAGNOSIS — H42 Glaucoma in diseases classified elsewhere: Secondary | ICD-10-CM | POA: Diagnosis not present

## 2021-02-23 DIAGNOSIS — J45909 Unspecified asthma, uncomplicated: Secondary | ICD-10-CM | POA: Diagnosis not present

## 2021-03-06 DIAGNOSIS — Z0000 Encounter for general adult medical examination without abnormal findings: Secondary | ICD-10-CM | POA: Diagnosis not present

## 2021-03-06 DIAGNOSIS — Z23 Encounter for immunization: Secondary | ICD-10-CM | POA: Diagnosis not present

## 2021-03-06 DIAGNOSIS — Z1211 Encounter for screening for malignant neoplasm of colon: Secondary | ICD-10-CM | POA: Diagnosis not present

## 2021-03-10 DIAGNOSIS — E1149 Type 2 diabetes mellitus with other diabetic neurological complication: Secondary | ICD-10-CM | POA: Diagnosis not present

## 2021-03-23 DIAGNOSIS — M069 Rheumatoid arthritis, unspecified: Secondary | ICD-10-CM | POA: Diagnosis not present

## 2021-03-24 DIAGNOSIS — E1139 Type 2 diabetes mellitus with other diabetic ophthalmic complication: Secondary | ICD-10-CM | POA: Diagnosis not present

## 2021-03-24 DIAGNOSIS — H42 Glaucoma in diseases classified elsewhere: Secondary | ICD-10-CM | POA: Diagnosis not present

## 2021-04-20 DIAGNOSIS — E1139 Type 2 diabetes mellitus with other diabetic ophthalmic complication: Secondary | ICD-10-CM | POA: Diagnosis not present

## 2021-04-20 DIAGNOSIS — H42 Glaucoma in diseases classified elsewhere: Secondary | ICD-10-CM | POA: Diagnosis not present

## 2021-04-20 DIAGNOSIS — M069 Rheumatoid arthritis, unspecified: Secondary | ICD-10-CM | POA: Diagnosis not present

## 2021-04-20 DIAGNOSIS — M329 Systemic lupus erythematosus, unspecified: Secondary | ICD-10-CM | POA: Diagnosis not present
